# Patient Record
Sex: Female | Born: 1950 | Race: White | Hispanic: No | Smoking: Never smoker
Health system: Southern US, Community
[De-identification: ages and names within clinical notes are randomized; demographics above are authoritative.]

## PROBLEM LIST (undated history)

## (undated) DIAGNOSIS — IMO0002 Reserved for concepts with insufficient information to code with codable children: Secondary | ICD-10-CM

## (undated) DIAGNOSIS — I10 Essential (primary) hypertension: Secondary | ICD-10-CM

## (undated) DIAGNOSIS — M8580 Other specified disorders of bone density and structure, unspecified site: Secondary | ICD-10-CM

## (undated) DIAGNOSIS — N200 Calculus of kidney: Secondary | ICD-10-CM

## (undated) DIAGNOSIS — K219 Gastro-esophageal reflux disease without esophagitis: Secondary | ICD-10-CM

## (undated) DIAGNOSIS — M1990 Unspecified osteoarthritis, unspecified site: Secondary | ICD-10-CM

## (undated) DIAGNOSIS — I639 Cerebral infarction, unspecified: Secondary | ICD-10-CM

## (undated) DIAGNOSIS — N814 Uterovaginal prolapse, unspecified: Secondary | ICD-10-CM

## (undated) HISTORY — DX: Reserved for concepts with insufficient information to code with codable children: IMO0002

## (undated) HISTORY — DX: Other specified disorders of bone density and structure, unspecified site: M85.80

## (undated) HISTORY — DX: Unspecified osteoarthritis, unspecified site: M19.90

## (undated) HISTORY — PX: TONSILLECTOMY: SUR1361

## (undated) HISTORY — DX: Gastro-esophageal reflux disease without esophagitis: K21.9

## (undated) HISTORY — DX: Uterovaginal prolapse, unspecified: N81.4

## (undated) HISTORY — DX: Calculus of kidney: N20.0

## (undated) HISTORY — DX: Cerebral infarction, unspecified: I63.9

---

## 1997-08-24 ENCOUNTER — Other Ambulatory Visit: Admission: RE | Admit: 1997-08-24 | Discharge: 1997-08-24 | Payer: Self-pay | Admitting: Obstetrics and Gynecology

## 1999-02-08 ENCOUNTER — Other Ambulatory Visit: Admission: RE | Admit: 1999-02-08 | Discharge: 1999-02-08 | Payer: Self-pay | Admitting: Obstetrics and Gynecology

## 2000-02-29 ENCOUNTER — Other Ambulatory Visit: Admission: RE | Admit: 2000-02-29 | Discharge: 2000-02-29 | Payer: Self-pay | Admitting: Obstetrics and Gynecology

## 2001-06-17 ENCOUNTER — Other Ambulatory Visit: Admission: RE | Admit: 2001-06-17 | Discharge: 2001-06-17 | Payer: Self-pay | Admitting: Obstetrics and Gynecology

## 2002-07-24 ENCOUNTER — Other Ambulatory Visit: Admission: RE | Admit: 2002-07-24 | Discharge: 2002-07-24 | Payer: Self-pay | Admitting: Obstetrics and Gynecology

## 2003-08-18 ENCOUNTER — Other Ambulatory Visit: Admission: RE | Admit: 2003-08-18 | Discharge: 2003-08-18 | Payer: Self-pay | Admitting: Obstetrics and Gynecology

## 2004-10-03 ENCOUNTER — Other Ambulatory Visit: Admission: RE | Admit: 2004-10-03 | Discharge: 2004-10-03 | Payer: Self-pay | Admitting: Obstetrics and Gynecology

## 2005-10-05 ENCOUNTER — Other Ambulatory Visit: Admission: RE | Admit: 2005-10-05 | Discharge: 2005-10-05 | Payer: Self-pay | Admitting: Obstetrics and Gynecology

## 2006-10-11 ENCOUNTER — Other Ambulatory Visit: Admission: RE | Admit: 2006-10-11 | Discharge: 2006-10-11 | Payer: Self-pay | Admitting: Obstetrics and Gynecology

## 2007-10-16 ENCOUNTER — Other Ambulatory Visit: Admission: RE | Admit: 2007-10-16 | Discharge: 2007-10-16 | Payer: Self-pay | Admitting: Obstetrics and Gynecology

## 2007-11-04 ENCOUNTER — Ambulatory Visit: Payer: Self-pay | Admitting: Obstetrics and Gynecology

## 2008-11-18 ENCOUNTER — Ambulatory Visit: Payer: Self-pay | Admitting: Obstetrics and Gynecology

## 2008-11-18 ENCOUNTER — Other Ambulatory Visit: Admission: RE | Admit: 2008-11-18 | Discharge: 2008-11-18 | Payer: Self-pay | Admitting: Obstetrics and Gynecology

## 2008-11-18 ENCOUNTER — Encounter: Payer: Self-pay | Admitting: Obstetrics and Gynecology

## 2008-12-22 ENCOUNTER — Ambulatory Visit: Payer: Self-pay | Admitting: Cardiology

## 2008-12-22 DIAGNOSIS — E782 Mixed hyperlipidemia: Secondary | ICD-10-CM

## 2010-11-17 ENCOUNTER — Encounter: Payer: Self-pay | Admitting: Obstetrics and Gynecology

## 2012-01-24 ENCOUNTER — Encounter: Payer: Self-pay | Admitting: Gynecology

## 2012-01-24 ENCOUNTER — Encounter: Payer: Self-pay | Admitting: Obstetrics and Gynecology

## 2012-01-24 DIAGNOSIS — N814 Uterovaginal prolapse, unspecified: Secondary | ICD-10-CM | POA: Insufficient documentation

## 2012-01-24 DIAGNOSIS — IMO0002 Reserved for concepts with insufficient information to code with codable children: Secondary | ICD-10-CM | POA: Insufficient documentation

## 2012-02-05 ENCOUNTER — Other Ambulatory Visit (HOSPITAL_COMMUNITY)
Admission: RE | Admit: 2012-02-05 | Discharge: 2012-02-05 | Disposition: A | Discharge status: Home / Self Care | Payer: BC Managed Care – PPO | Source: Ambulatory Visit | Attending: Obstetrics and Gynecology | Admitting: Obstetrics and Gynecology

## 2012-02-05 ENCOUNTER — Ambulatory Visit (INDEPENDENT_AMBULATORY_CARE_PROVIDER_SITE_OTHER): Payer: BC Managed Care – PPO | Admitting: Obstetrics and Gynecology

## 2012-02-05 ENCOUNTER — Encounter: Payer: Self-pay | Admitting: Obstetrics and Gynecology

## 2012-02-05 VITALS — BP 120/70 | Ht 62.0 in | Wt 202.0 lb

## 2012-02-05 DIAGNOSIS — Z01419 Encounter for gynecological examination (general) (routine) without abnormal findings: Secondary | ICD-10-CM | POA: Insufficient documentation

## 2012-02-05 DIAGNOSIS — Z23 Encounter for immunization: Secondary | ICD-10-CM

## 2012-02-05 DIAGNOSIS — N200 Calculus of kidney: Secondary | ICD-10-CM | POA: Insufficient documentation

## 2012-02-05 LAB — CBC WITH DIFFERENTIAL/PLATELET
Basophils Relative: 0 % (ref 0–1)
Eosinophils Absolute: 0.1 10*3/uL (ref 0.0–0.7)
Lymphs Abs: 2.3 10*3/uL (ref 0.7–4.0)
MCH: 30.9 pg (ref 26.0–34.0)
Neutrophils Relative %: 42 % — ABNORMAL LOW (ref 43–77)
Platelets: 243 10*3/uL (ref 150–400)
RBC: 4.37 MIL/uL (ref 3.87–5.11)
WBC: 4.8 10*3/uL (ref 4.0–10.5)

## 2012-02-05 LAB — HEMOGLOBIN A1C
Hgb A1c MFr Bld: 6 % — ABNORMAL HIGH (ref <5.7)
Mean Plasma Glucose: 126 mg/dL — ABNORMAL HIGH (ref <117)

## 2012-02-05 LAB — LIPID PANEL
HDL: 61 mg/dL (ref >39)
LDL Cholesterol: 164 mg/dL — ABNORMAL HIGH (ref 0–99)

## 2012-02-05 NOTE — Addendum Note (Signed)
Addended by: Dayna Barker on: 02/05/2012 09:48 AM   Modules accepted: Orders

## 2012-02-05 NOTE — Progress Notes (Signed)
Patient came to see me today for her annual GYN exam. She is doing well without hormone replacement. She was previously on that. She is aware of her uterine prolapse and cystocele but does not feel it is severe enough to require surgery. She has no urinary incontinence or urgency. She's never had an abnormal Pap smear. Her last Pap smear was 2010. She has had 2 normal bone densities. Her last bone density was 2009. She has never had a colonoscopy. We referred her to Dr. Daleen Squibb because of elevated lipids. At that point she did not require medication. She has both her paternal and maternal aunt who had early onset breast cancer. She doesn't think either was tested for BRCA1 and 2. She just  had a mammogram.  Physical examination:Joanna Good present. HEENT within normal limits. Neck: Thyroid not large. No masses. Supraclavicular nodes: not enlarged. Breasts: Examined in both sitting and lying  position. No skin changes and no masses. Abdomen: Soft no guarding rebound or masses or hernia. Pelvic: External: Within normal limits. BUS: Within normal limits. Vaginal:within normal limits. Good estrogen effect. Second-degree cystocele. Cervix: clean. Uterus: Normal size and shape With 1.5 uterine descensus.  Adnexa: No masses. Rectovaginal exam: Confirmatory and negative. Extremities: Within normal limits.  Assessment: Uterine prolapse. Cystocele. Family history of early onset breast cancer. Hyperlipidemia.  Plan: Lipid panel done fasting. Asked  patient to  schedule colonoscopy. Patient to go to cancer center for genetic testing. Continue yearly mammograms. Pap done.The new Pap smear guidelines were discussed with the patient.

## 2012-02-05 NOTE — Patient Instructions (Signed)
Call [facility] to discuss genetic testing for BRCA1 and BRCA2.

## 2012-02-06 LAB — URINALYSIS W MICROSCOPIC + REFLEX CULTURE
Bilirubin Urine: NEGATIVE
Casts: NONE SEEN
Crystals: NONE SEEN
Glucose, UA: NEGATIVE mg/dL
Specific Gravity, Urine: 1.01 (ref 1.005–1.030)
pH: 7.5 (ref 5.0–8.0)

## 2012-02-09 LAB — URINE CULTURE

## 2012-02-15 ENCOUNTER — Encounter: Payer: Self-pay | Admitting: Obstetrics and Gynecology

## 2013-06-19 ENCOUNTER — Encounter: Payer: Self-pay | Admitting: Women's Health

## 2013-07-10 ENCOUNTER — Encounter: Payer: Self-pay | Admitting: Women's Health

## 2013-07-30 ENCOUNTER — Other Ambulatory Visit: Payer: Self-pay | Admitting: Women's Health

## 2013-07-30 ENCOUNTER — Encounter: Payer: Self-pay | Admitting: Women's Health

## 2013-07-30 ENCOUNTER — Ambulatory Visit (INDEPENDENT_AMBULATORY_CARE_PROVIDER_SITE_OTHER): Payer: BC Managed Care – PPO | Admitting: Women's Health

## 2013-07-30 VITALS — BP 120/76 | Ht 62.0 in | Wt 218.0 lb

## 2013-07-30 DIAGNOSIS — Z833 Family history of diabetes mellitus: Secondary | ICD-10-CM

## 2013-07-30 DIAGNOSIS — E079 Disorder of thyroid, unspecified: Secondary | ICD-10-CM

## 2013-07-30 DIAGNOSIS — Z01419 Encounter for gynecological examination (general) (routine) without abnormal findings: Secondary | ICD-10-CM

## 2013-07-30 DIAGNOSIS — Z13220 Encounter for screening for lipoid disorders: Secondary | ICD-10-CM

## 2013-07-30 LAB — CBC WITH DIFFERENTIAL/PLATELET
BASOS ABS: 0 10*3/uL (ref 0.0–0.1)
Basophils Relative: 1 % (ref 0–1)
Eosinophils Absolute: 0 10*3/uL (ref 0.0–0.7)
Eosinophils Relative: 1 % (ref 0–5)
HEMATOCRIT: 39.9 % (ref 36.0–46.0)
HEMOGLOBIN: 13.6 g/dL (ref 12.0–15.0)
LYMPHS PCT: 49 % — AB (ref 12–46)
Lymphs Abs: 2.1 10*3/uL (ref 0.7–4.0)
MCH: 30.6 pg (ref 26.0–34.0)
MCHC: 34.1 g/dL (ref 30.0–36.0)
MCV: 89.7 fL (ref 78.0–100.0)
MONO ABS: 0.3 10*3/uL (ref 0.1–1.0)
Monocytes Relative: 7 % (ref 3–12)
NEUTROS ABS: 1.8 10*3/uL (ref 1.7–7.7)
Neutrophils Relative %: 42 % — ABNORMAL LOW (ref 43–77)
Platelets: 241 10*3/uL (ref 150–400)
RBC: 4.45 MIL/uL (ref 3.87–5.11)
RDW: 13.9 % (ref 11.5–15.5)
WBC: 4.2 10*3/uL (ref 4.0–10.5)

## 2013-07-30 LAB — COMPREHENSIVE METABOLIC PANEL
ALBUMIN: 4.2 g/dL (ref 3.5–5.2)
ALT: 11 U/L (ref 0–35)
AST: 15 U/L (ref 0–37)
Alkaline Phosphatase: 72 U/L (ref 39–117)
BUN: 11 mg/dL (ref 6–23)
CALCIUM: 9.4 mg/dL (ref 8.4–10.5)
CHLORIDE: 104 meq/L (ref 96–112)
CO2: 27 mEq/L (ref 19–32)
Creat: 0.8 mg/dL (ref 0.50–1.10)
Glucose, Bld: 121 mg/dL — ABNORMAL HIGH (ref 70–99)
POTASSIUM: 4.5 meq/L (ref 3.5–5.3)
Sodium: 138 mEq/L (ref 135–145)
Total Bilirubin: 0.5 mg/dL (ref 0.2–1.2)
Total Protein: 6.8 g/dL (ref 6.0–8.3)

## 2013-07-30 LAB — LIPID PANEL
Cholesterol: 215 mg/dL — ABNORMAL HIGH (ref 0–200)
HDL: 56 mg/dL (ref >39)
LDL CALC: 140 mg/dL — AB (ref 0–99)
Total CHOL/HDL Ratio: 3.8 Ratio
Triglycerides: 97 mg/dL (ref <150)
VLDL: 19 mg/dL (ref 0–40)

## 2013-07-30 NOTE — Progress Notes (Signed)
Joanna Good 04-25-1950 [id number]    History:    Presents for annual exam. Patient Dr. Eda Paschal last in 2013. Postmenopausal /no HRT/no bleeding/not sexually active-husbands health.. Normal Pap and mammogram history. Normal DEXA x2, per chart reports, scans not available. Has not had a colonoscopy. Asymptomatic cystocele with uterine prolapse.  Past medical history, past surgical history, family history and social history were all reviewed and documented in the EPIC chart. Retired principal, working part time with gifted students. Maternal aunt, paternal aunt-breast cancer. Father diabetes. Has 2 children 4 grandchildren all doing well.  ROS:  A  12 point ROS was performed and pertinent positives and negatives are included.  Exam:  Filed Vitals:   07/30/13 0833  BP: 120/76    General appearance:  Normal Thyroid:  Symmetrical, normal in size, without palpable masses or nodularity. Respiratory  Auscultation:  Clear without wheezing or rhonchi Cardiovascular  Auscultation:  Regular rate, without rubs, murmurs or gallops  Edema/varicosities:  Not grossly evident Abdominal  Soft,nontender, without masses, guarding or rebound.  Liver/spleen:  No organomegaly noted  Hernia:  None appreciated  Skin  Inspection:  Grossly normal   Breasts: Examined lying and sitting.     Right: Without masses, retractions, discharge or axillary adenopathy.     Left: Without masses, retractions, discharge or axillary adenopathy. Gentitourinary   Inguinal/mons:  Normal without inguinal adenopathy  External genitalia:  Normal  BUS/Urethra/Skene's glands:  Normal  Vagina:  +3 cystocele  Cervix:  Normal  Uterus:   normal in size, shape and contour.  Midline and mobile  Adnexa/parametria:     Rt: Without masses or tenderness.   Lt: Without masses or tenderness.  Anus and perineum: Normal  Digital rectal exam: Normal sphincter tone without palpated masses or tenderness  Assessment/Plan:  63 y.o.MWF  G2P2  for annual exam with no complaints.  Postmenopausal/no HRT/no bleeding + 3 cystocele - asymptomatic  Plan: SBE's, continue annual mammogram, calcium rich diet, vitamin D 2000 daily encouraged. CBC, comprehensive metabolic panel, lipid panel, TSH, UA, Pap normal 2013, new screening guidelines reviewed. Zostavac and screening colonoscopy recommended. Denies urinary symptoms or discomfort from cystocele, will watch at this time.   [place]: This dictation was prepared with Dragon/digital dictation.  Any transcriptional errors that result are unintentional. Harrington Challenger [facility], 10:01 AM 07/30/2013

## 2013-07-30 NOTE — Patient Instructions (Signed)
Health Recommendations for Postmenopausal Women Respected and ongoing research has looked at the most common causes of death, disability, and poor quality of life in postmenopausal women. The causes include heart disease, diseases of blood vessels, diabetes, depression, cancer, and bone loss (osteoporosis). Many things can be done to help lower the chances of developing these and other common problems: CARDIOVASCULAR DISEASE Heart Disease: A heart attack is a medical emergency. Know the signs and symptoms of a heart attack. Below are things women can do to reduce their risk for heart disease.   Do not smoke. If you smoke, quit.  Aim for a healthy weight. Being overweight causes many preventable deaths. Eat a healthy and balanced diet and drink an adequate amount of liquids.  Get moving. Make a commitment to be more physically active. Aim for 30 minutes of activity on most, if not all days of the week.  Eat for heart health. Choose a diet that is low in saturated fat and cholesterol and eliminate trans fat. Include whole grains, vegetables, and fruits. Read and understand the labels on food containers before buying.  Know your numbers. Ask your caregiver to check your blood pressure, cholesterol (total, HDL, LDL, triglycerides) and blood glucose. Work with your caregiver on improving your entire clinical picture.  High blood pressure. Limit or stop your table salt intake (try salt substitute and food seasonings). Avoid salty foods and drinks. Read labels on food containers before buying. Eating well and exercising can help control high blood pressure. STROKE  Stroke is a medical emergency. Stroke may be the result of a blood clot in a blood vessel in the brain or by a brain hemorrhage (bleeding). Know the signs and symptoms of a stroke. To lower the risk of developing a stroke:  Avoid fatty foods.  Quit smoking.  Control your diabetes, blood pressure, and irregular heart rate. THROMBOPHLEBITIS  (BLOOD CLOT) OF THE LEG  Becoming overweight and leading a stationary lifestyle may also contribute to developing blood clots. Controlling your diet and exercising will help lower the risk of developing blood clots. CANCER SCREENING  Breast Cancer: Take steps to reduce your risk of breast cancer.  You should practice "breast self-awareness." This means understanding the normal appearance and feel of your breasts and should include breast self-examination. Any changes detected, no matter how small, should be reported to your caregiver.  After age 40, you should have a clinical breast exam (CBE) every year.  Starting at age 40, you should consider having a mammogram (breast X-ray) every year.  If you have a family history of breast cancer, talk to your caregiver about genetic screening.  If you are at high risk for breast cancer, talk to your caregiver about having an MRI and a mammogram every year.  Intestinal or Stomach Cancer: Tests to consider are a rectal exam, fecal occult blood, sigmoidoscopy, and colonoscopy. Women who are high risk may need to be screened at an earlier age and more often.  Cervical Cancer:  Beginning at age 30, you should have a Pap test every 3 years as long as the past 3 Pap tests have been normal.  If you have had past treatment for cervical cancer or a condition that could lead to cancer, you need Pap tests and screening for cancer for at least 20 years after your treatment.  If you had a hysterectomy for a problem that was not cancer or a condition that could lead to cancer, then you no longer need Pap tests.    If you are between ages 65 and 70, and you have had normal Pap tests going back 10 years, you no longer need Pap tests.  If Pap tests have been discontinued, risk factors (such as a new sexual partner) need to be reassessed to determine if screening should be resumed.  Some medical problems can increase the chance of getting cervical cancer. In these  cases, your caregiver may recommend more frequent screening and Pap tests.  Uterine Cancer: If you have vaginal bleeding after reaching menopause, you should notify your caregiver.  Ovarian cancer: Other than yearly pelvic exams, there are no reliable tests available to screen for ovarian cancer at this time except for yearly pelvic exams.  Lung Cancer: Yearly chest X-rays can detect lung cancer and should be done on high risk women, such as cigarette smokers and women with chronic lung disease (emphysema).  Skin Cancer: A complete body skin exam should be done at your yearly examination. Avoid overexposure to the sun and ultraviolet light lamps. Use a strong sun block cream when in the sun. All of these things are important in lowering the risk of skin cancer. MENOPAUSE Menopause Symptoms: Hormone therapy products are effective for treating symptoms associated with menopause:  Moderate to severe hot flashes.  Night sweats.  Mood swings.  Headaches.  Tiredness.  Loss of sex drive.  Insomnia.  Other symptoms. Hormone replacement carries certain risks, especially in older women. Women who use or are thinking about using estrogen or estrogen with progestin treatments should discuss that with their caregiver. Your caregiver will help you understand the benefits and risks. The ideal dose of hormone replacement therapy is not known. The Food and Drug Administration (FDA) has concluded that hormone therapy should be used only at the lowest doses and for the shortest amount of time to reach treatment goals.  OSTEOPOROSIS Protecting Against Bone Loss and Preventing Fracture: If you use hormone therapy for prevention of bone loss (osteoporosis), the risks for bone loss must outweigh the risk of the therapy. Ask your caregiver about other medications known to be safe and effective for preventing bone loss and fractures. To guard against bone loss or fractures, the following is recommended:  If  you are less than age 50, take 1000 mg of calcium and at least 600 mg of Vitamin D per day.  If you are greater than age 50 but less than age 70, take 1200 mg of calcium and at least 600 mg of Vitamin D per day.  If you are greater than age 70, take 1200 mg of calcium and at least 800 mg of Vitamin D per day. Smoking and excessive alcohol intake increases the risk of osteoporosis. Eat foods rich in calcium and vitamin D and do weight bearing exercises several times a week as your caregiver suggests. DIABETES Diabetes Melitus: If you have Type I or Type 2 diabetes, you should keep your blood sugar under control with diet, exercise and recommended medication. Avoid too many sweets, starchy and fatty foods. Being overweight can make control more difficult. COGNITION AND MEMORY Cognition and Memory: Menopausal hormone therapy is not recommended for the prevention of cognitive disorders such as Alzheimer's disease or memory loss.  DEPRESSION  Depression may occur at any age, but is common in elderly women. The reasons may be because of physical, medical, social (loneliness), or financial problems and needs. If you are experiencing depression because of medical problems and control of symptoms, talk to your caregiver about this. Physical activity and   exercise may help with mood and sleep. Community and volunteer involvement may help your sense of value and worth. If you have depression and you feel that the problem is getting worse or becoming severe, talk to your caregiver about treatment options that are best for you. ACCIDENTS  Accidents are common and can be serious in the elderly woman. Prepare your house to prevent accidents. Eliminate throw rugs, place hand bars in the bath, shower and toilet areas. Avoid wearing high heeled shoes or walking on wet, snowy, and icy areas. Limit or stop driving if you have vision or hearing problems, or you feel you are unsteady with you movements and  reflexes. HEPATITIS C Hepatitis C is a type of viral infection affecting the liver. It is spread mainly through contact with blood from an infected person. It can be treated, but if left untreated, it can lead to severe liver damage over years. Many people who are infected do not know that the virus is in their blood. If you are a "baby-boomer", it is recommended that you have one screening test for Hepatitis C. IMMUNIZATIONS  Several immunizations are important to consider having during your senior years, including:   Tetanus, diptheria, and pertussis booster shot.  Influenza every year before the flu season begins.  Pneumonia vaccine.  Shingles vaccine.  Others as indicated based on your specific needs. Talk to your caregiver about these. Document Released: 04/13/2005 Document Revised: 02/06/2012 Document Reviewed: 12/08/2007 [employer] Patient Information 2014 [employer].  

## 2013-07-31 LAB — HEMOGLOBIN A1C
Hgb A1c MFr Bld: 5.9 % — ABNORMAL HIGH (ref <5.7)
Mean Plasma Glucose: 123 mg/dL — ABNORMAL HIGH (ref <117)

## 2013-07-31 LAB — TSH: TSH: 1.008 u[IU]/mL (ref 0.350–4.500)

## 2014-01-04 ENCOUNTER — Encounter: Payer: Self-pay | Admitting: Women's Health

## 2014-01-06 ENCOUNTER — Other Ambulatory Visit (HOSPITAL_COMMUNITY): Payer: Self-pay | Admitting: Internal Medicine

## 2014-01-06 ENCOUNTER — Ambulatory Visit (HOSPITAL_COMMUNITY)
Admission: RE | Admit: 2014-01-06 | Discharge: 2014-01-06 | Disposition: A | Discharge status: Home / Self Care | Payer: BC Managed Care – PPO | Source: Ambulatory Visit | Attending: Internal Medicine | Admitting: Internal Medicine

## 2014-01-06 DIAGNOSIS — R202 Paresthesia of skin: Secondary | ICD-10-CM | POA: Diagnosis present

## 2014-01-06 DIAGNOSIS — I639 Cerebral infarction, unspecified: Secondary | ICD-10-CM

## 2014-01-11 ENCOUNTER — Other Ambulatory Visit (HOSPITAL_COMMUNITY): Payer: Self-pay | Admitting: Internal Medicine

## 2014-01-11 DIAGNOSIS — H539 Unspecified visual disturbance: Secondary | ICD-10-CM

## 2014-01-12 ENCOUNTER — Ambulatory Visit (HOSPITAL_COMMUNITY)
Admission: RE | Admit: 2014-01-12 | Discharge: 2014-01-12 | Disposition: A | Discharge status: Home / Self Care | Payer: BC Managed Care – PPO | Source: Ambulatory Visit | Attending: Internal Medicine | Admitting: Internal Medicine

## 2014-01-12 DIAGNOSIS — H539 Unspecified visual disturbance: Secondary | ICD-10-CM

## 2014-01-12 DIAGNOSIS — H538 Other visual disturbances: Secondary | ICD-10-CM | POA: Insufficient documentation

## 2014-01-12 DIAGNOSIS — I639 Cerebral infarction, unspecified: Secondary | ICD-10-CM | POA: Insufficient documentation

## 2014-01-12 DIAGNOSIS — R200 Anesthesia of skin: Secondary | ICD-10-CM | POA: Diagnosis not present

## 2014-01-13 ENCOUNTER — Encounter: Payer: Self-pay | Admitting: *Deleted

## 2014-01-13 ENCOUNTER — Other Ambulatory Visit (HOSPITAL_COMMUNITY): Payer: Self-pay | Admitting: Internal Medicine

## 2014-01-13 ENCOUNTER — Ambulatory Visit (HOSPITAL_COMMUNITY)
Admission: RE | Admit: 2014-01-13 | Discharge: 2014-01-13 | Disposition: A | Discharge status: Home / Self Care | Payer: BC Managed Care – PPO | Source: Ambulatory Visit | Attending: Internal Medicine | Admitting: Internal Medicine

## 2014-01-13 DIAGNOSIS — I639 Cerebral infarction, unspecified: Secondary | ICD-10-CM

## 2014-01-13 DIAGNOSIS — H539 Unspecified visual disturbance: Secondary | ICD-10-CM | POA: Diagnosis not present

## 2014-01-13 DIAGNOSIS — R200 Anesthesia of skin: Secondary | ICD-10-CM | POA: Diagnosis not present

## 2014-01-14 ENCOUNTER — Ambulatory Visit (INDEPENDENT_AMBULATORY_CARE_PROVIDER_SITE_OTHER): Payer: Self-pay

## 2014-01-14 ENCOUNTER — Encounter: Payer: Self-pay | Admitting: Neurology

## 2014-01-14 ENCOUNTER — Ambulatory Visit (INDEPENDENT_AMBULATORY_CARE_PROVIDER_SITE_OTHER): Payer: BC Managed Care – PPO | Admitting: Neurology

## 2014-01-14 ENCOUNTER — Ambulatory Visit (HOSPITAL_COMMUNITY)
Admission: RE | Admit: 2014-01-14 | Discharge: 2014-01-14 | Disposition: A | Discharge status: Home / Self Care | Payer: BC Managed Care – PPO | Source: Ambulatory Visit | Attending: Internal Medicine | Admitting: Internal Medicine

## 2014-01-14 VITALS — BP 140/70 | HR 85 | Ht 63.25 in | Wt 220.0 lb

## 2014-01-14 DIAGNOSIS — Z0289 Encounter for other administrative examinations: Secondary | ICD-10-CM

## 2014-01-14 DIAGNOSIS — I639 Cerebral infarction, unspecified: Secondary | ICD-10-CM

## 2014-01-14 DIAGNOSIS — E785 Hyperlipidemia, unspecified: Secondary | ICD-10-CM | POA: Insufficient documentation

## 2014-01-14 DIAGNOSIS — I340 Nonrheumatic mitral (valve) insufficiency: Secondary | ICD-10-CM | POA: Diagnosis not present

## 2014-01-14 DIAGNOSIS — I6340 Cerebral infarction due to embolism of unspecified cerebral artery: Secondary | ICD-10-CM

## 2014-01-14 DIAGNOSIS — I517 Cardiomegaly: Secondary | ICD-10-CM

## 2014-01-14 MED ORDER — ATORVASTATIN CALCIUM 20 MG PO TABS: 20.0000 mg | ORAL_TABLET | Freq: Every day | ORAL | Status: DC

## 2014-01-14 NOTE — Progress Notes (Signed)
[facility] [address] [place]. [place] [id number] [phone]       OFFICE CONSULT NOTE  [name] Date of Birth:  04/03/1950 Medical Record Number:  [id number]   Referring MD: Catalina PizzaZach Hall Reason for Referral:  Stroke  HPI: [age] year pleasant Caucasian lady who on 01/05/14 noted  slight tingling involving the left lip  but ignored it. Next morning she woke up with the tingling involving the left cheek as well as the leg and later on involving the left hand as well. She describes warmth-like sensation in the left side. She saw Dr. Margo AyeHall who ordered a CT scan of the head which was unremarkable. Patient's symptoms persisted and in fact 3 days later she noticed some decreased peripheral vision on the left with seeing spots as well as her taste was quite altered. 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 She underwent carotid ultrasound on 01/13/14 in [place] which shows no significant extra cranial vascular stenosis. Transthoracic echo was done today 01/14/14 which shows normal ejection fraction without cardiac source of embolism. Review of her prior labs in the computer show hemoglobin A1c of 5.8 on 07/30/13 with total cholesterol of 215, LDL 140, HDL 56 and triglycerides 97 mg percent. She has no known history of atrial fibrillation, palpitations or cardiac problems. She has no history of hypertension , smoking and diabetes and stroke in a young age. She has no history of DVT or pulmonary embolism. She was started on aspirin 325 mg but has not been started on statin yet. She has noted improvement in her paresthesias in her arms though she still has some numbness on her left cheek and fingertips. Her vision difficulties seem to have resolved.  ROS:   14 system review of systems is positive for numbness, tingling, feeling cold, joint pain, hip  pain and all other systems negative  PMH:  Past Medical History  Diagnosis Date  . Uterine prolapse   . Cystocele   . Kidney stones   . High cholesterol   . Acid reflux     Social History:  History   Social History  . Marital Status: Married    Spouse Name: N/A    Number of Children: 2  . Years of Education: MASTERS   Occupational History  . Not on file.   Social History Main Topics  . Smoking status: Never Smoker   . Smokeless tobacco: Never Used  . Alcohol Use: 0.0 oz/week    0 Not specified per week     Comment: Rare  . Drug Use: No  . Sexual Activity: No   Other Topics Concern  . Not on file   Social History Narrative   Patient is married with 2 children.   Patient is right handed.   Patient has her Masters degree.   Patient drinks 2 cups daily.    Medications:   Current Outpatient Prescriptions on File Prior to Visit  Medication Sig Dispense Refill  . Calcium Carbonate-Vitamin D (CALCIUM + D PO) Take by mouth.    . Multiple Vitamin (MULTIVITAMIN) tablet Take 1 tablet by mouth daily.    . Omega-3 Fatty Acids (FISH OIL PO) Take by mouth.    . triamcinolone (NASACORT AQ) 55 MCG/ACT AERO nasal inhaler Place 2 sprays into the nose daily.     No current facility-administered medications on  file prior to visit.    Allergies:   Allergies  Allergen Reactions  . Bee Venom   . Claritin [Loratadine] Nausea And Vomiting  . Ibuprofen   . Sulfonamide Derivatives     Physical Exam General: well developed, well nourished, seated, in no evident distress Head: head normocephalic and atraumatic.   Neck: supple with no carotid or supraclavicular bruits Cardiovascular: regular rate and rhythm, no murmurs Musculoskeletal: no deformity Skin:  no rash/petichiae Vascular:  Normal pulses all extremities Filed Vitals:   01/14/14 1250  BP: 140/70  Pulse: 85    Neurologic Exam Mental Status: Awake and fully alert. Oriented to place and time. Recent and remote  memory intact. Attention span, concentration and fund of knowledge appropriate. Mood and affect appropriate.  Cranial Nerves: Fundoscopic exam reveals sharp disc margins. Pupils equal, briskly reactive to light. Extraocular movements full without nystagmus. Visual fields full to confrontation. Hearing intact. Facial sensation intact. Face, tongue, palate moves normally and symmetrically.  Motor: Normal bulk and tone. Normal strength in all tested extremity muscles. Sensory.: intact to touch , pinprick , position and vibratory sensation. Except left cheek and fingertips  Coordination: Rapid alternating movements normal in all extremities. Finger-to-nose and heel-to-shin performed accurately bilaterally. Gait and Station: Arises from chair without difficulty. Stance is normal. Gait demonstrates normal stride length and balance . Able to heel, toe and tandem walk without difficulty.  Reflexes: 1+ and symmetric. Toes downgoing.   NIHSS  1 Modified Rankin 1   ASSESSMENT: [id number] year Caucasian lady with intermittent left face arm and leg paresthesias as well as vision difficulties due to embolic right occipital and thalamic infarcts one week ago. Neurological exam reveals only minor paresthesias but no visual field deficits. Stroke workup so far unyielding but incomplete. Vascular risk factors of hyperlipidemia only    PLAN: I had a long discussion with the patient with regards to her recent right occipital and thalamic infarcts, discussed and reviewed personally her imaging studies, echocardiogram carotid dopplers,prior lipid profile results and answered questions. Continue aspirin for stroke prevention and start Lipitor 20 mg daily with LDL cholesterol goal below 170 mg percent. Check transcranial Doppler study. We discussed risks for recurrent strokes, stroke risk factors and need to maintain aggressive risk factor reduction, healthy diet and regular activity and maintain ideal weight.The patient may  consider possible participation in the RESPECT ESUS trial. She has shown some interest. We will give her written information to review to see if she wants to participate.Patient needs a heart monitor to evaluate for paroxysmal atrial fibrillation which can be done through the trial if she chooses to participate. Return for follow-up in 2 months or call earlier if necessary.    Note: This document was prepared with digital dictation and possible smart phrase technology. Any transcriptional errors that result from this process are unintentional.

## 2014-01-14 NOTE — Progress Notes (Signed)
  Echocardiogram 2D Echocardiogram has been performed.  Joanna Good 01/14/2014, 9:33 AM

## 2014-01-14 NOTE — Patient Instructions (Signed)
I had a long discussion with the patient with regards to her recent right occipital and thalamic infarcts, discussed and reviewed personally her imaging studies, echocardiogram carotid dopplers,prior lipid profile results and answered questions. Continue aspirin for stroke prevention and start Lipitor 20 mg daily with LDL cholesterol goal below 170 mg percent. Check transcranial Doppler study. We discussed risks for recurrent strokes, stroke risk factors and need to maintain aggressive risk factor reduction, healthy diet and regular activity and maintain ideal weight.The patient may consider possible participation in the RESPECT ESUS trial. She has shown some interest. We will give her written information to review to see if she wants to participate.Patient needs a heart monitor to evaluate for paroxysmal atrial fibrillation which can be done through the trial if she chooses to participate. Return for follow-up in 2 months or call earlier if necessary.   Stroke Prevention Some medical conditions and behaviors are associated with an increased chance of having a stroke. You may prevent a stroke by making healthy choices and managing medical conditions. HOW CAN I REDUCE MY RISK OF HAVING A STROKE?   Stay physically active. Get at least 30 minutes of activity on most or all days.  Do not smoke. It may also be helpful to avoid exposure to secondhand smoke.  Limit alcohol use. Moderate alcohol use is considered to be:  No more than 2 drinks per day for men.  No more than 1 drink per day for nonpregnant women.  Eat healthy foods. This involves:  Eating 5 or more servings of fruits and vegetables a day.  Making dietary changes that address high blood pressure (hypertension), high cholesterol, diabetes, or obesity.  Manage your cholesterol levels.  Making food choices that are high in fiber and low in saturated fat, trans fat, and cholesterol may control cholesterol levels.  Take any prescribed  medicines to control cholesterol as directed by your health care provider.  Manage your diabetes.  Controlling your carbohydrate and sugar intake is recommended to manage diabetes.  Take any prescribed medicines to control diabetes as directed by your health care provider.  Control your hypertension.  Making food choices that are low in salt (sodium), saturated fat, trans fat, and cholesterol is recommended to manage hypertension.  Take any prescribed medicines to control hypertension as directed by your health care provider.  Maintain a healthy weight.  Reducing calorie intake and making food choices that are low in sodium, saturated fat, trans fat, and cholesterol are recommended to manage weight.  Stop drug abuse.  Avoid taking birth control pills.  Talk to your health care provider about the risks of taking birth control pills if you are over 63 years old, smoke, get migraines, or have ever had a blood clot.  Get evaluated for sleep disorders (sleep apnea).  Talk to your health care provider about getting a sleep evaluation if you snore a lot or have excessive sleepiness.  Take medicines only as directed by your health care provider.  For some people, aspirin or blood thinners (anticoagulants) are helpful in reducing the risk of forming abnormal blood clots that can lead to stroke. If you have the irregular heart rhythm of atrial fibrillation, you should be on a blood thinner unless there is a good reason you cannot take them.  Understand all your medicine instructions.  Make sure that other conditions (such as anemia or atherosclerosis) are addressed. SEEK IMMEDIATE MEDICAL CARE IF:   You have sudden weakness or numbness of the face, arm, or leg,  especially on one side of the body.  Your face or eyelid droops to one side.  You have sudden confusion.  You have trouble speaking (aphasia) or understanding.  You have sudden trouble seeing in one or both eyes.  You have  sudden trouble walking.  You have dizziness.  You have a loss of balance or coordination.  You have a sudden, severe headache with no known cause.  You have new chest pain or an irregular heartbeat. Any of these symptoms may represent a serious problem that is an emergency. Do not wait to see if the symptoms will go away. Get medical help at once. Call your local emergency services (911 in [place] Do not drive yourself to the hospital. Document Released: 03/29/2004 Document Revised: 07/06/2013 Document Reviewed: 08/22/2012 [facility] Patient Information 2015 [place], [place]. This information is not intended to replace advice given to you by your health care provider. Make sure you discuss any questions you have with your health care provider.

## 2014-01-18 ENCOUNTER — Telehealth: Payer: Self-pay | Admitting: Neurology

## 2014-01-18 ENCOUNTER — Other Ambulatory Visit: Payer: Self-pay | Admitting: Neurology

## 2014-01-18 DIAGNOSIS — I6300 Cerebral infarction due to thrombosis of unspecified precerebral artery: Secondary | ICD-10-CM

## 2014-01-21 ENCOUNTER — Telehealth: Payer: Self-pay | Admitting: Neurology

## 2014-01-21 ENCOUNTER — Other Ambulatory Visit: Payer: Self-pay | Admitting: Neurology

## 2014-01-21 ENCOUNTER — Telehealth: Payer: Self-pay

## 2014-01-21 DIAGNOSIS — I6340 Cerebral infarction due to embolism of unspecified cerebral artery: Secondary | ICD-10-CM

## 2014-01-21 NOTE — Telephone Encounter (Signed)
Called patient to let her know that we have scheduled her for a CTA and basic metabolic panel on 01/26/14. I could not reach her but left her a message to call me today so I could give her the details.

## 2014-01-21 NOTE — Telephone Encounter (Signed)
Patient wanted to speak with the Research Coordinator Amy Clare GandyRiddle about a study trial. Coordinator was not present at the moment, and I told the patient that the coordinator would call her back.

## 2014-01-21 NOTE — Telephone Encounter (Signed)
Called patient and left message that results for testing ordered by Dr Pearlean BrownieSethi were not available yet, instructed that patient should contact Dr Scharlene GlossHall's office for the results of the testing that was done on Wednesday 01/13/14, and to call the office back with any questions or concerns. Dr Pearlean BrownieSethi do you have results available yet

## 2014-01-26 ENCOUNTER — Encounter (HOSPITAL_COMMUNITY): Payer: Self-pay

## 2014-01-26 ENCOUNTER — Ambulatory Visit (HOSPITAL_COMMUNITY): Payer: BC Managed Care – PPO

## 2014-01-26 ENCOUNTER — Ambulatory Visit (HOSPITAL_COMMUNITY)
Admission: RE | Admit: 2014-01-26 | Discharge: 2014-01-26 | Disposition: A | Discharge status: Home / Self Care | Payer: BC Managed Care – PPO | Source: Ambulatory Visit | Attending: Neurology | Admitting: Neurology

## 2014-01-26 DIAGNOSIS — H538 Other visual disturbances: Secondary | ICD-10-CM | POA: Diagnosis not present

## 2014-01-26 DIAGNOSIS — I6300 Cerebral infarction due to thrombosis of unspecified precerebral artery: Secondary | ICD-10-CM | POA: Insufficient documentation

## 2014-01-26 LAB — POCT I-STAT CREATININE: Creatinine, Ser: 0.8 mg/dL (ref 0.50–1.10)

## 2014-01-26 MED ORDER — IOHEXOL 350 MG/ML SOLN
50.0000 mL | Freq: Once | INTRAVENOUS | Status: AC | PRN
  Administered 2014-01-26: 50 mL via INTRAVENOUS

## 2014-02-01 ENCOUNTER — Telehealth: Payer: Self-pay | Admitting: Neurology

## 2014-02-01 NOTE — Telephone Encounter (Signed)
Patient is calling to discuss Cat Scan results. Please call.

## 2014-02-01 NOTE — Telephone Encounter (Signed)
Patient requesting CT results, please call patient.

## 2014-02-10 ENCOUNTER — Encounter (INDEPENDENT_AMBULATORY_CARE_PROVIDER_SITE_OTHER): Payer: BC Managed Care – PPO

## 2014-02-10 ENCOUNTER — Encounter (INDEPENDENT_AMBULATORY_CARE_PROVIDER_SITE_OTHER): Payer: Self-pay

## 2014-02-10 DIAGNOSIS — Z0289 Encounter for other administrative examinations: Secondary | ICD-10-CM

## 2014-02-12 ENCOUNTER — Encounter (INDEPENDENT_AMBULATORY_CARE_PROVIDER_SITE_OTHER): Payer: Self-pay

## 2014-02-12 ENCOUNTER — Ambulatory Visit (INDEPENDENT_AMBULATORY_CARE_PROVIDER_SITE_OTHER): Payer: Self-pay | Admitting: Neurology

## 2014-02-12 DIAGNOSIS — I6340 Cerebral infarction due to embolism of unspecified cerebral artery: Secondary | ICD-10-CM

## 2014-02-12 DIAGNOSIS — Z0289 Encounter for other administrative examinations: Secondary | ICD-10-CM

## 2014-02-12 DIAGNOSIS — I639 Cerebral infarction, unspecified: Secondary | ICD-10-CM

## 2014-02-12 NOTE — Progress Notes (Signed)
The patient was seen today for followup with the research protocol.

## 2014-04-05 ENCOUNTER — Encounter: Payer: Self-pay | Admitting: Neurology

## 2014-04-05 ENCOUNTER — Ambulatory Visit (INDEPENDENT_AMBULATORY_CARE_PROVIDER_SITE_OTHER): Payer: BC Managed Care – PPO | Admitting: Neurology

## 2014-04-05 VITALS — BP 114/73 | HR 86 | Ht 63.25 in | Wt 225.4 lb

## 2014-04-05 DIAGNOSIS — I6990 Unspecified sequelae of unspecified cerebrovascular disease: Secondary | ICD-10-CM

## 2014-04-05 NOTE — Patient Instructions (Signed)
I had a long d/w patient about her recent stroke, risk for recurrent stroke/TIAs, personally independently reviewed imaging studies and stroke evaluation results and answered questions.Continue RESPECT ESUS study medication( Pradaxa versus aspirin ) for secondary stroke prevention and maintain strict control of hypertension with blood pressure goal below 130/90, diabetes with hemoglobin A1c goal below 6.5% and lipids with LDL cholesterol goal below 100 mg/dL. Followup in the future as per study protocol on 05/14/2014

## 2014-04-05 NOTE — Progress Notes (Signed)
[facility] [address] [place]. [place] [address] [phone]       OFFICE CONSULT NOTE  [name] Date of Birth:  [birth year] Medical Record Number:  [id number]   Referring MD: Joanna Good Reason for Referral:  Stroke  Initial Consult 01/14/2014: 63 year pleasant Caucasian lady who on 01/05/14 noted  slight tingling involving the left lip  but ignored it. Next morning she woke up with the tingling involving the left cheek as well as the leg and later on involving the left hand as well. She describes warmth-like sensation in the left side. She saw Dr. Margo Good who ordered a CT scan of the head which was unremarkable. Patient's symptoms persisted and in fact 3 days later she noticed some decreased peripheral vision on the left with seeing spots as well as her taste was quite altered. 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 She underwent carotid ultrasound on 01/13/14 in [place] which shows no significant extra cranial vascular stenosis. Transthoracic echo was done today 01/14/14 which shows normal ejection fraction without cardiac source of embolism. Review of her prior labs in the computer show hemoglobin A1c of 5.8 on 07/30/13 with total cholesterol of 215, LDL 140, HDL 56 and triglycerides 97 mg percent. She has no known history of atrial fibrillation, palpitations or cardiac problems. She has no history of hypertension , smoking and diabetes and stroke in a young age. She has no history of DVT or pulmonary embolism. She was started on aspirin 325 mg but has not been started on statin yet. She has noted improvement in her paresthesias in her arms though she still has some numbness on her left cheek and fingertips. Her vision difficulties seem to have resolved. 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 Transcranial Doppler study on 01/14/2014 was suboptimal due to  poorBITEMPORAL WINDOWS. She is participating in the RESPECT ESUS trial  and is tolerating this study medication without any side effects.  ROS:   14 system review of systems is positive for numbness, tingling,  and all other systems negative  PMH:  Past Medical History  Diagnosis Date  . Uterine prolapse   . Cystocele   . Kidney stones   . High cholesterol   . Acid reflux     Social History:  History   Social History  . Marital Status: Married    Spouse Name: N/A    Number of Children: 2  . Years of Education: MASTERS   Occupational History  . Not on file.   Social History Main Topics  . Smoking status: Never Smoker   . Smokeless tobacco: Never Used  . Alcohol Use: 0.0 oz/week    0 Not specified per week     Comment: Rare  . Drug Use: No  . Sexual Activity: No   Other Topics Concern  . Not on file   Social History Narrative   Patient is married with 2 children.   Patient is right handed.   Patient has her Masters degree.   Patient drinks 2 cups daily.    Medications:   Current Outpatient Prescriptions on File Prior to Visit  Medication Sig Dispense Refill  .  atorvastatin (LIPITOR) 20 MG tablet Take 1 tablet (20 mg total) by mouth daily. 60 tablet 1  . Calcium Carbonate-Vitamin D (CALCIUM + D PO) Take by mouth.    . Multiple Vitamin (MULTIVITAMIN) tablet Take 1 tablet by mouth daily.    . Omega-3 Fatty Acids (FISH OIL PO) Take by mouth.    . triamcinolone (NASACORT AQ) 55 MCG/ACT AERO nasal inhaler Place 2 sprays into the nose daily.    Marland Kitchen. aspirin 325 MG tablet Take 325 mg by mouth daily.     No current facility-administered medications on file prior to visit.    Allergies:   Allergies  Allergen Reactions  . Bee Venom   .  Claritin [Loratadine] Nausea And Vomiting  . Ibuprofen   . Sulfonamide Derivatives     Physical Exam General: well developed, well nourished, seated, in no evident distress Head: head normocephalic and atraumatic.   Neck: supple with no carotid or supraclavicular bruits Cardiovascular: regular rate and rhythm, no murmurs Musculoskeletal: no deformity Skin:  no rash/petichiae Vascular:  Normal pulses all extremities Filed Vitals:   04/05/14 1357  BP: 114/73  Pulse: 86    Neurologic Exam Mental Status: Awake and fully alert. Oriented to place and time. Recent and remote memory intact. Attention span, concentration and fund of knowledge appropriate. Mood and affect appropriate.  Cranial Nerves: Fundoscopic exam not done  . Pupils equal, briskly reactive to light. Extraocular movements full without nystagmus. Visual fields full to confrontation. Hearing intact. Facial sensation intact. Face, tongue, palate moves normally and symmetrically.  Motor: Normal bulk and tone. Normal strength in all tested extremity muscles. Sensory.: intact to touch , pinprick , position and vibratory sensation. Except left cheek and fingertips  Coordination: Rapid alternating movements normal in all extremities. Finger-to-nose and heel-to-shin performed accurately bilaterally. Gait and Station: Arises from chair without difficulty. Stance is normal. Gait demonstrates normal stride length and balance . Able to heel, toe and tandem walk without difficulty.  Reflexes: 1+ and symmetric. Toes downgoing.   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  Marland Kitchen. Vascular risk factors of hyperlipidemia only    PLAN:  I had a long d/w patient about her recent stroke, risk for recurrent stroke/TIAs, personally independently  reviewed imaging studies and stroke evaluation results and answered questions.Continue RESPECT ESUS study medication( Pradaxa versus aspirin ) for secondary stroke prevention and maintain strict control of hypertension with blood pressure goal below 130/90, diabetes with hemoglobin A1c goal below 6.5% and lipids with LDL cholesterol goal below 100 mg/dL. Followup in the future as per study protocol on 05/14/2014    Note: This document was prepared with digital dictation and possible smart phrase technology. Any transcriptional errors that result from this process are unintentional.

## 2014-04-09 ENCOUNTER — Ambulatory Visit: Payer: BC Managed Care – PPO | Admitting: Neurology

## 2014-05-14 ENCOUNTER — Encounter: Payer: Self-pay | Admitting: Neurology

## 2014-05-14 ENCOUNTER — Other Ambulatory Visit: Payer: Self-pay | Admitting: Neurology

## 2014-05-14 ENCOUNTER — Ambulatory Visit (INDEPENDENT_AMBULATORY_CARE_PROVIDER_SITE_OTHER): Payer: Self-pay | Admitting: Neurology

## 2014-05-14 DIAGNOSIS — I6340 Cerebral infarction due to embolism of unspecified cerebral artery: Secondary | ICD-10-CM

## 2014-05-14 DIAGNOSIS — I639 Cerebral infarction, unspecified: Secondary | ICD-10-CM

## 2014-05-14 MED ORDER — ATORVASTATIN CALCIUM 20 MG PO TABS: 20.0000 mg | ORAL_TABLET | Freq: Every day | ORAL | Status: DC

## 2014-05-14 NOTE — Progress Notes (Signed)
Pt was not seen by me today during research follow up visit. However, I refilled her lipitor to her pharmacy.   Marvel PlanJindong Casy Brunetto, MD PhD Stroke Neurology 05/14/2014 12:27 PM

## 2014-08-05 ENCOUNTER — Encounter: Payer: Self-pay | Admitting: Women's Health

## 2014-08-05 ENCOUNTER — Ambulatory Visit (INDEPENDENT_AMBULATORY_CARE_PROVIDER_SITE_OTHER): Payer: BC Managed Care – PPO | Admitting: Women's Health

## 2014-08-05 ENCOUNTER — Other Ambulatory Visit (HOSPITAL_COMMUNITY)
Admission: RE | Admit: 2014-08-05 | Discharge: 2014-08-05 | Disposition: A | Discharge status: Home / Self Care | Payer: BC Managed Care – PPO | Source: Ambulatory Visit | Attending: Women's Health | Admitting: Women's Health

## 2014-08-05 VITALS — BP 132/84 | Ht 63.0 in | Wt 225.0 lb

## 2014-08-05 DIAGNOSIS — Z1151 Encounter for screening for human papillomavirus (HPV): Secondary | ICD-10-CM | POA: Insufficient documentation

## 2014-08-05 DIAGNOSIS — M810 Age-related osteoporosis without current pathological fracture: Secondary | ICD-10-CM | POA: Diagnosis not present

## 2014-08-05 DIAGNOSIS — Z01419 Encounter for gynecological examination (general) (routine) without abnormal findings: Secondary | ICD-10-CM | POA: Insufficient documentation

## 2014-08-05 NOTE — Patient Instructions (Signed)
Health Recommendations for Postmenopausal Women Respected and ongoing research has looked at the most common causes of death, disability, and poor quality of life in postmenopausal women. The causes include heart disease, diseases of blood vessels, diabetes, depression, cancer, and bone loss (osteoporosis). Many things can be done to help lower the chances of developing these and other common problems. CARDIOVASCULAR DISEASE Heart Disease: A heart attack is a medical emergency. Know the signs and symptoms of a heart attack. Below are things women can do to reduce their risk for heart disease.   Do not smoke. If you smoke, quit.  Aim for a healthy weight. Being overweight causes many preventable deaths. Eat a healthy and balanced diet and drink an adequate amount of liquids.  Get moving. Make a commitment to be more physically active. Aim for 30 minutes of activity on most, if not all days of the week.  Eat for heart health. Choose a diet that is low in saturated fat and cholesterol and eliminate trans fat. Include whole grains, vegetables, and fruits. Read and understand the labels on food containers before buying.  Know your numbers. Ask your caregiver to check your blood pressure, cholesterol (total, HDL, LDL, triglycerides) and blood glucose. Work with your caregiver on improving your entire clinical picture.  High blood pressure. Limit or stop your table salt intake (try salt substitute and food seasonings). Avoid salty foods and drinks. Read labels on food containers before buying. Eating well and exercising can help control high blood pressure. STROKE  Stroke is a medical emergency. Stroke may be the result of a blood clot in a blood vessel in the brain or by a brain hemorrhage (bleeding). Know the signs and symptoms of a stroke. To lower the risk of developing a stroke:  Avoid fatty foods.  Quit smoking.  Control your diabetes, blood pressure, and irregular heart rate. THROMBOPHLEBITIS  (BLOOD CLOT) OF THE LEG  Becoming overweight and leading a stationary lifestyle may also contribute to developing blood clots. Controlling your diet and exercising will help lower the risk of developing blood clots. CANCER SCREENING  Breast Cancer: Take steps to reduce your risk of breast cancer.  You should practice "breast self-awareness." This means understanding the normal appearance and feel of your breasts and should include breast self-examination. Any changes detected, no matter how small, should be reported to your caregiver.  After age 40, you should have a clinical breast exam (CBE) every year.  Starting at age 40, you should consider having a mammogram (breast X-ray) every year.  If you have a family history of breast cancer, talk to your caregiver about genetic screening.  If you are at high risk for breast cancer, talk to your caregiver about having an MRI and a mammogram every year.  Intestinal or Stomach Cancer: Tests to consider are a rectal exam, fecal occult blood, sigmoidoscopy, and colonoscopy. Women who are high risk may need to be screened at an earlier age and more often.  Cervical Cancer:  Beginning at age 30, you should have a Pap test every 3 years as long as the past 3 Pap tests have been normal.  If you have had past treatment for cervical cancer or a condition that could lead to cancer, you need Pap tests and screening for cancer for at least 20 years after your treatment.  If you had a hysterectomy for a problem that was not cancer or a condition that could lead to cancer, then you no longer need Pap tests.    If you are between ages 65 and 70, and you have had normal Pap tests going back 10 years, you no longer need Pap tests.  If Pap tests have been discontinued, risk factors (such as a new sexual partner) need to be reassessed to determine if screening should be resumed.  Some medical problems can increase the chance of getting cervical cancer. In these  cases, your caregiver may recommend more frequent screening and Pap tests.  Uterine Cancer: If you have vaginal bleeding after reaching menopause, you should notify your caregiver.  Ovarian Cancer: Other than yearly pelvic exams, there are no reliable tests available to screen for ovarian cancer at this time except for yearly pelvic exams.  Lung Cancer: Yearly chest X-rays can detect lung cancer and should be done on high risk women, such as cigarette smokers and women with chronic lung disease (emphysema).  Skin Cancer: A complete body skin exam should be done at your yearly examination. Avoid overexposure to the sun and ultraviolet light lamps. Use a strong sun block cream when in the sun. All of these things are important for lowering the risk of skin cancer. MENOPAUSE Menopause Symptoms: Hormone therapy products are effective for treating symptoms associated with menopause:  Moderate to severe hot flashes.  Night sweats.  Mood swings.  Headaches.  Tiredness.  Loss of sex drive.  Insomnia.  Other symptoms. Hormone replacement carries certain risks, especially in older women. Women who use or are thinking about using estrogen or estrogen with progestin treatments should discuss that with their caregiver. Your caregiver will help you understand the benefits and risks. The ideal dose of hormone replacement therapy is not known. The Food and Drug Administration (FDA) has concluded that hormone therapy should be used only at the lowest doses and for the shortest amount of time to reach treatment goals.  OSTEOPOROSIS Protecting Against Bone Loss and Preventing Fracture If you use hormone therapy for prevention of bone loss (osteoporosis), the risks for bone loss must outweigh the risk of the therapy. Ask your caregiver about other medications known to be safe and effective for preventing bone loss and fractures. To guard against bone loss or fractures, the following is recommended:  If  you are younger than age 50, take 1000 mg of calcium and at least 600 mg of Vitamin D per day.  If you are older than age 50 but younger than age 70, take 1200 mg of calcium and at least 600 mg of Vitamin D per day.  If you are older than age 70, take 1200 mg of calcium and at least 800 mg of Vitamin D per day. Smoking and excessive alcohol intake increases the risk of osteoporosis. Eat foods rich in calcium and vitamin D and do weight bearing exercises several times a week as your caregiver suggests. DIABETES Diabetes Mellitus: If you have type I or type 2 diabetes, you should keep your blood sugar under control with diet, exercise, and recommended medication. Avoid starchy and fatty foods, and too many sweets. Being overweight can make diabetes control more difficult. COGNITION AND MEMORY Cognition and Memory: Menopausal hormone therapy is not recommended for the prevention of cognitive disorders such as Alzheimer's disease or memory loss.  DEPRESSION  Depression may occur at any age, but it is common in elderly women. This may be because of physical, medical, social (loneliness), or financial problems and needs. If you are experiencing depression because of medical problems and control of symptoms, talk to your caregiver about this. Physical   activity and exercise may help with mood and sleep. Community and volunteer involvement may improve your sense of value and worth. If you have depression and you feel that the problem is getting worse or becoming severe, talk to your caregiver about which treatment options are best for you. ACCIDENTS  Accidents are common and can be serious in elderly woman. Prepare your house to prevent accidents. Eliminate throw rugs, place hand bars in bath, shower, and toilet areas. Avoid wearing high heeled shoes or walking on wet, snowy, and icy areas. Limit or stop driving if you have vision or hearing problems, or if you feel you are unsteady with your movements and  reflexes. HEPATITIS C Hepatitis C is a type of viral infection affecting the liver. It is spread mainly through contact with blood from an infected person. It can be treated, but if left untreated, it can lead to severe liver damage over the years. Many people who are infected do not know that the virus is in their blood. If you are a "baby-boomer", it is recommended that you have one screening test for Hepatitis C. IMMUNIZATIONS  Several immunizations are important to consider having during your senior years, including:   Tetanus, diphtheria, and pertussis booster shot.  Influenza every year before the flu season begins.  Pneumonia vaccine.  Shingles vaccine.  Others, as indicated based on your specific needs. Talk to your caregiver about these. Document Released: 04/13/2005 Document Revised: 07/06/2013 Document Reviewed: 12/08/2007 ExitCare Patient Information 2015 [employer]. This information is not intended to replace advice given to you by your health care provider. Make sure you discuss any questions you have with your health care provider.  

## 2014-08-05 NOTE — Progress Notes (Signed)
Joanna ShutterJanet [name] [birth year] [id number]    History:    Presents for annual exam.  Postmenopausal/no HRT/no bleeding. Normal Pap and mammogram history. Normal DEXA. Has not received Zostavax. Asymptomatic 3+ cystocele. 11/2013 mini stroke/not hospitalized, primary care managing   Past medical history, past surgical history, family history and social history were all reviewed and documented in the EPIC chart. Retired principal. Husband died 08/2013 MI in his sleep. Father diabetes. Parents, sister hypertension.  ROS:  A ROS was performed and pertinent positives and negatives are included.  Exam:  Filed Vitals:   08/05/14 0927  BP: 132/84    General appearance:  Normal Thyroid:  Symmetrical, normal in size, without palpable masses or nodularity. Respiratory  Auscultation:  Clear without wheezing or rhonchi Cardiovascular  Auscultation:  Regular rate, without rubs, murmurs or gallops  Edema/varicosities:  Not grossly evident Abdominal  Soft,nontender, without masses, guarding or rebound.  Liver/spleen:  No organomegaly noted  Hernia:  None appreciated  Skin  Inspection:  Grossly normal   Breasts: Examined lying and sitting.     Right: Without masses, retractions, discharge or axillary adenopathy.     Left: Without masses, retractions, discharge or axillary adenopathy. Gentitourinary   Inguinal/mons:  Normal without inguinal adenopathy  External genitalia:  Normal  BUS/Urethra/Skene's glands:  Normal  Vagina:  +3 cystocele  Cervix:  Normal  Uterus:   normal in size, shape and contour.  Midline and mobile  Adnexa/parametria:     Rt: Without masses or tenderness.   Lt: Without masses or tenderness.  Anus and perineum: Normal  Digital rectal exam: Normal sphincter tone without palpated masses or tenderness  Assessment/Plan:  64 y.o. W WF G2 P2 for annual exam.  Postmenopausal/no HRT/no bleeding +3 asymptomatic cystocele Mini stroke without residual 11/2013-neurologist  managing Primary care-labs and meds  Plan: SBE's, continue annual screening mammogram, calcium rich diet, vitamin D 1000 daily encouraged. Instructed to have vitamin D level checked at primary care with next blood draw. History of normal DEXA, will repeat. Home safety, fall prevention and importance of regular weightbearing exercise reviewed. Encouraged to decrease calories for weight loss. UA, Pap with HR HPV typing, new screening guidelines reviewed. Again reviewed importance of screening colonoscopy, declines.   Harrington ChallengerYOUNG,NANCY J [facility] ServicesWHNP, 10:43 AM 08/05/2014

## 2014-08-06 LAB — URINALYSIS W MICROSCOPIC + REFLEX CULTURE
BILIRUBIN URINE: NEGATIVE
Bacteria, UA: NONE SEEN
Casts: NONE SEEN
Crystals: NONE SEEN
GLUCOSE, UA: NEGATIVE mg/dL
HGB URINE DIPSTICK: NEGATIVE
Ketones, ur: NEGATIVE mg/dL
Nitrite: NEGATIVE
PH: 6.5 (ref 5.0–8.0)
PROTEIN: NEGATIVE mg/dL
Squamous Epithelial / LPF: NONE SEEN
Urobilinogen, UA: 0.2 mg/dL (ref 0.0–1.0)

## 2014-08-06 LAB — CYTOLOGY - PAP

## 2014-08-08 LAB — URINE CULTURE: Colony Count: >=100000

## 2014-08-09 ENCOUNTER — Other Ambulatory Visit: Payer: Self-pay | Admitting: Women's Health

## 2014-08-09 DIAGNOSIS — R8271 Bacteriuria: Secondary | ICD-10-CM

## 2014-08-13 ENCOUNTER — Other Ambulatory Visit: Payer: Self-pay | Admitting: Gynecology

## 2014-08-13 ENCOUNTER — Encounter (INDEPENDENT_AMBULATORY_CARE_PROVIDER_SITE_OTHER): Payer: Self-pay

## 2014-08-13 DIAGNOSIS — M810 Age-related osteoporosis without current pathological fracture: Secondary | ICD-10-CM

## 2014-08-13 DIAGNOSIS — Z0289 Encounter for other administrative examinations: Secondary | ICD-10-CM

## 2014-09-03 DIAGNOSIS — M8580 Other specified disorders of bone density and structure, unspecified site: Secondary | ICD-10-CM

## 2014-09-03 HISTORY — DX: Other specified disorders of bone density and structure, unspecified site: M85.80

## 2014-09-14 ENCOUNTER — Ambulatory Visit (INDEPENDENT_AMBULATORY_CARE_PROVIDER_SITE_OTHER): Payer: BC Managed Care – PPO

## 2014-09-14 ENCOUNTER — Other Ambulatory Visit: Payer: BC Managed Care – PPO

## 2014-09-14 ENCOUNTER — Other Ambulatory Visit: Payer: Self-pay | Admitting: Gynecology

## 2014-09-14 DIAGNOSIS — M8580 Other specified disorders of bone density and structure, unspecified site: Secondary | ICD-10-CM | POA: Diagnosis not present

## 2014-09-14 DIAGNOSIS — R8271 Bacteriuria: Secondary | ICD-10-CM

## 2014-09-14 DIAGNOSIS — Z13820 Encounter for screening for osteoporosis: Secondary | ICD-10-CM

## 2014-09-14 DIAGNOSIS — M810 Age-related osteoporosis without current pathological fracture: Secondary | ICD-10-CM

## 2014-09-15 ENCOUNTER — Encounter: Payer: Self-pay | Admitting: Gynecology

## 2014-09-15 LAB — URINALYSIS W MICROSCOPIC + REFLEX CULTURE
Bacteria, UA: NONE SEEN
Bilirubin Urine: NEGATIVE
Casts: NONE SEEN
Crystals: NONE SEEN
GLUCOSE, UA: NEGATIVE mg/dL
Hgb urine dipstick: NEGATIVE
KETONES UR: NEGATIVE mg/dL
NITRITE: NEGATIVE
Protein, ur: NEGATIVE mg/dL
Specific Gravity, Urine: <1.005 — ABNORMAL LOW (ref 1.005–1.030)
Squamous Epithelial / LPF: NONE SEEN
Urobilinogen, UA: 0.2 mg/dL (ref 0.0–1.0)
pH: 7 (ref 5.0–8.0)

## 2014-09-19 LAB — URINE CULTURE: Colony Count: 75000

## 2014-10-04 ENCOUNTER — Telehealth: Payer: Self-pay | Admitting: *Deleted

## 2014-10-04 MED ORDER — NITROFURANTOIN MONOHYD MACRO 100 MG PO CAPS: 100.0000 mg | ORAL_CAPSULE | Freq: Two times a day (BID) | ORAL | Status: DC

## 2014-10-04 NOTE — Telephone Encounter (Signed)
(  You are back up MD) pt called c/o burning with urination, urgency had chills over the weekend with low grade fever, no fever or chills now. Still has burning with urination and urgency. OV offered to pt, she is aware nancy is off today asked if you could sent her Rx to pharmacy? Please advise

## 2014-10-04 NOTE — Telephone Encounter (Signed)
Pt aware rx sent.  

## 2014-10-04 NOTE — Telephone Encounter (Signed)
Macrobid 100 mg twice a day 7 days 

## 2014-11-09 ENCOUNTER — Encounter (INDEPENDENT_AMBULATORY_CARE_PROVIDER_SITE_OTHER): Payer: Self-pay

## 2014-11-09 DIAGNOSIS — Z0289 Encounter for other administrative examinations: Secondary | ICD-10-CM

## 2014-12-30 NOTE — Telephone Encounter (Signed)
Error

## 2015-02-17 ENCOUNTER — Encounter (INDEPENDENT_AMBULATORY_CARE_PROVIDER_SITE_OTHER): Payer: Self-pay

## 2015-02-17 DIAGNOSIS — Z0289 Encounter for other administrative examinations: Secondary | ICD-10-CM

## 2015-02-24 ENCOUNTER — Telehealth: Payer: Self-pay

## 2015-02-24 NOTE — Telephone Encounter (Signed)
Called and asked if still taking Celecoxib, Subject said" yes" . She is taking for joint pain for a long time. Even before starting the study.

## 2015-05-18 ENCOUNTER — Telehealth: Payer: Self-pay

## 2015-05-18 NOTE — Telephone Encounter (Signed)
Spoke to the subject Re: Visit 7 for R ESUS trial

## 2015-07-14 IMAGING — CT CT ANGIO HEAD
3 of 11 series · 18 of 47 positions shown · IV contrast (APPLIED)
Comparison: MRI 01/12/2014

CLINICAL DATA: Recent cerebral infarction.  Blurred vision.

EXAM:
CT ANGIOGRAPHY HEAD
TECHNIQUE: Multidetector CT imaging of the head was performed using the
standard protocol during bolus administration of intravenous
contrast. Multiplanar CT image reconstructions and MIPs were
obtained to evaluate the vascular anatomy.
CONTRAST:  50mL OMNIPAQUE IOHEXOL 350 MG/ML SOLN

[Series 8: thin axial · axial · 0.45mm/px · z∈[+1514,+1643]mm · 12 of 153 slices shown]
[im 12/153  brain]
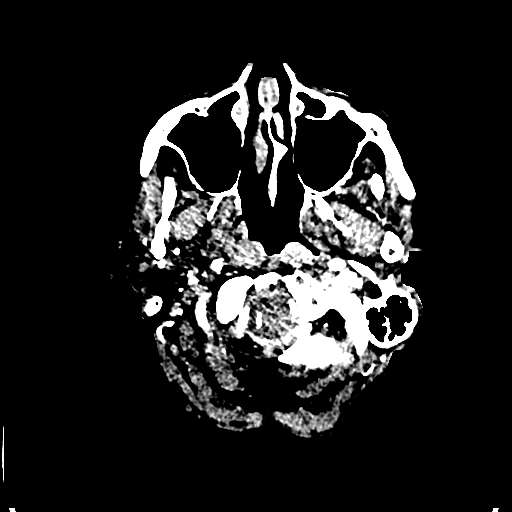
[im 24/153  bone]
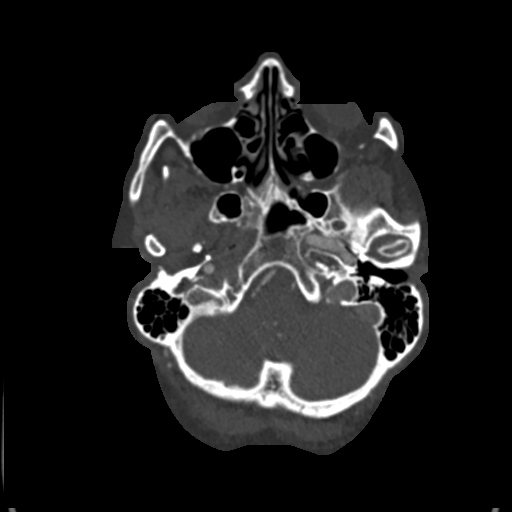
[im 36/153  brain]
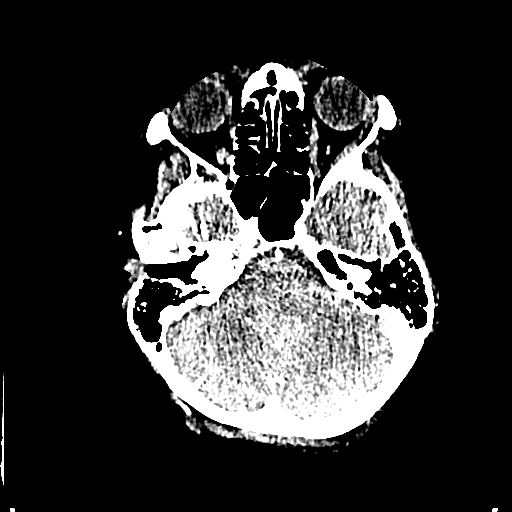
[im 47/153  bone]
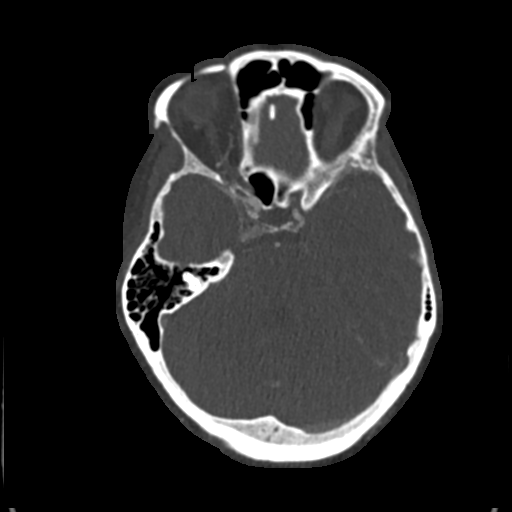
[im 59/153  brain]
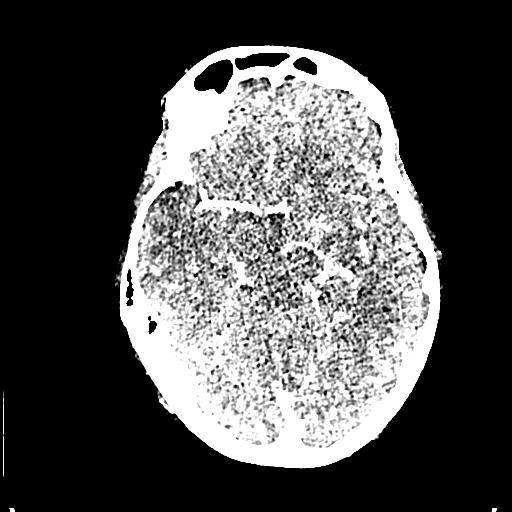
[im 71/153  bone]
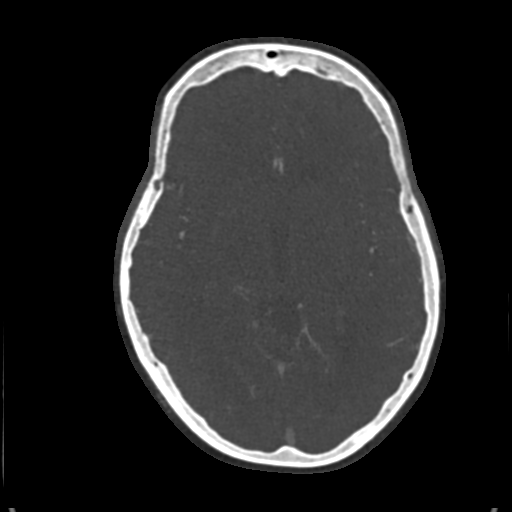
[im 82/153  brain]
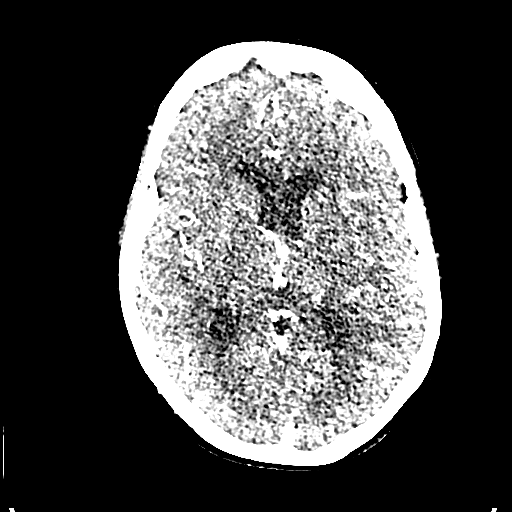
[im 94/153  bone]
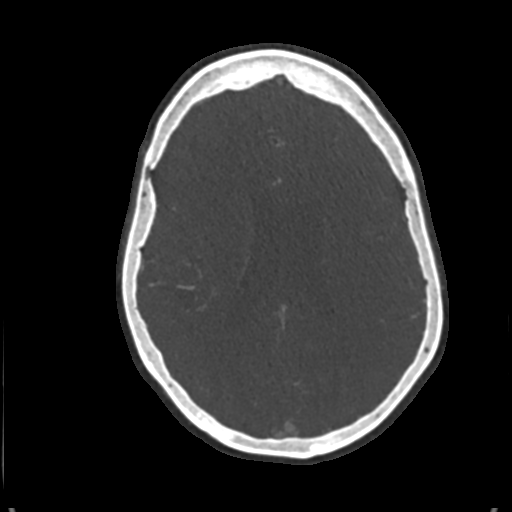
[im 106/153  brain]
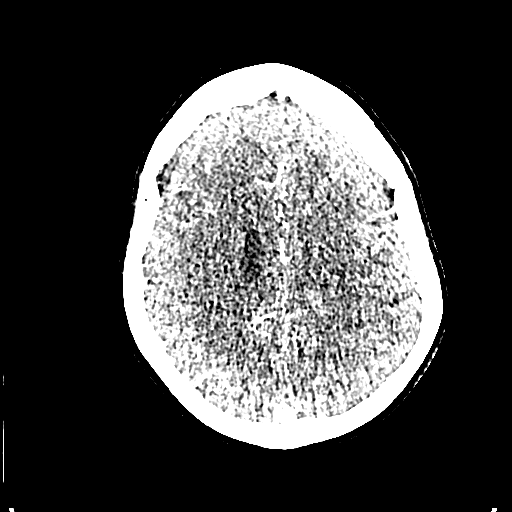
[im 117/153  bone]
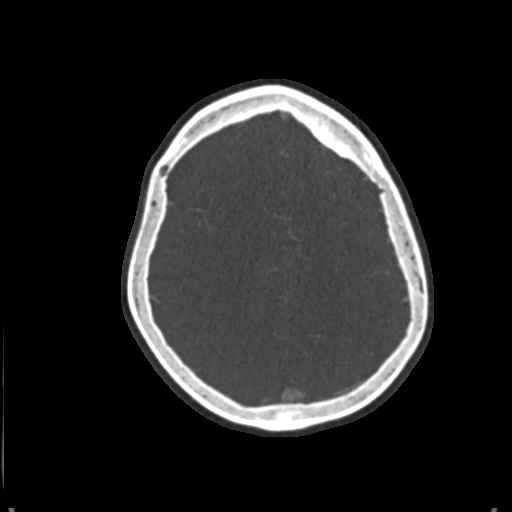
[im 129/153  brain]
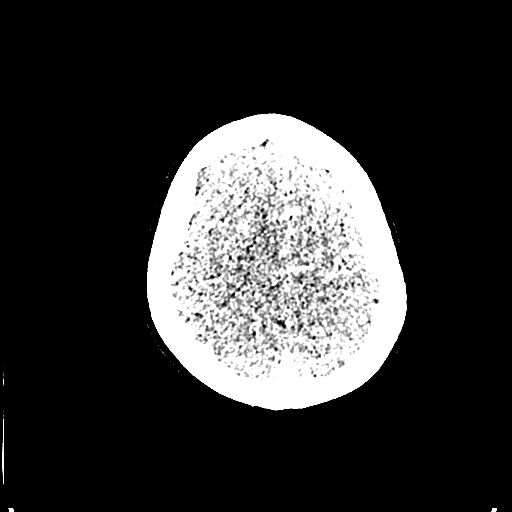
[im 141/153  bone]
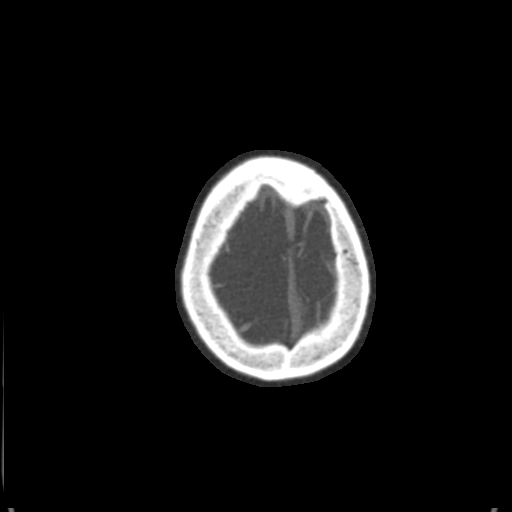

[Series 9: coronal thin · coronal · 0.32mm/px · 3 of 204 slices shown]
[im 59/204  brain]
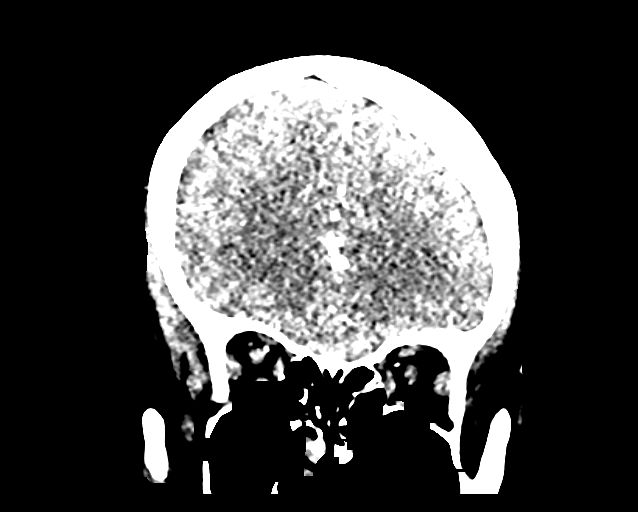
[im 88/204  brain]
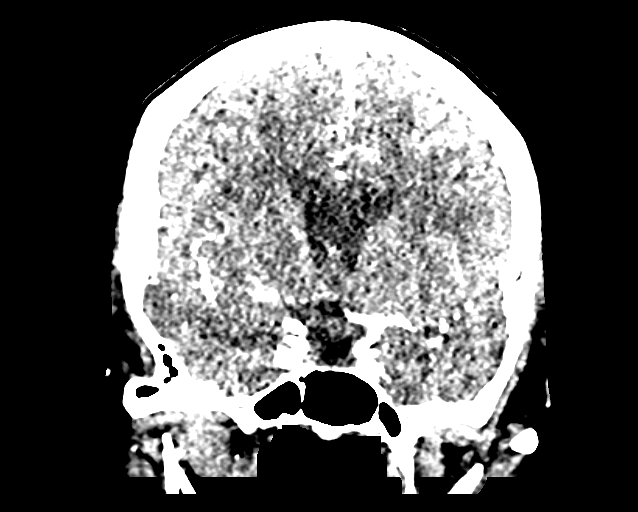
[im 117/204  brain]
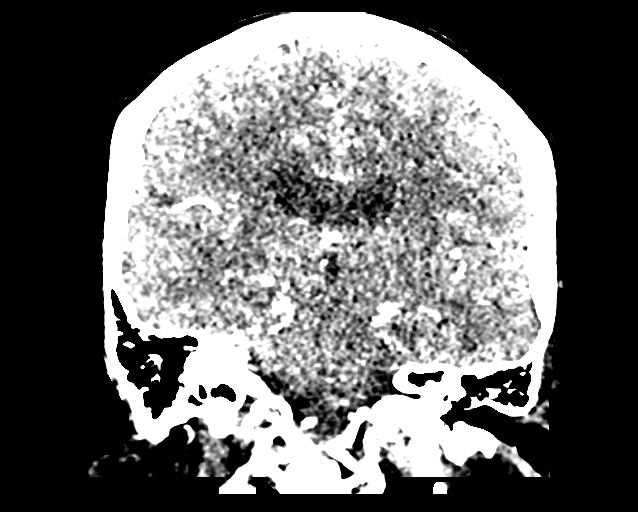

[Series 11: sagittal thin · sagittal · 0.36mm/px · 3 of 160 slices shown]
[im 32/160  brain]
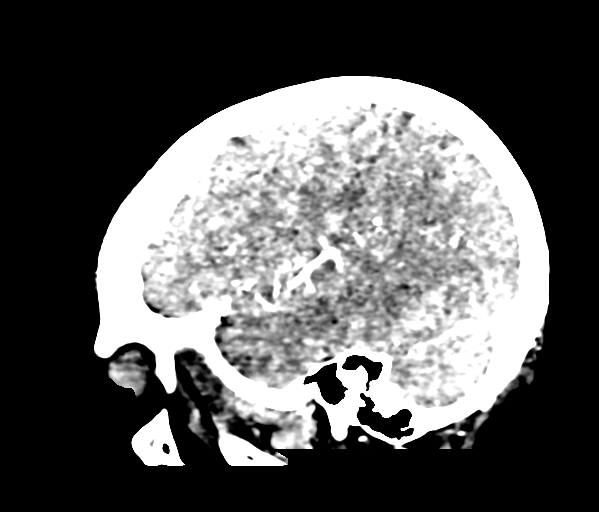
[im 64/160  brain]
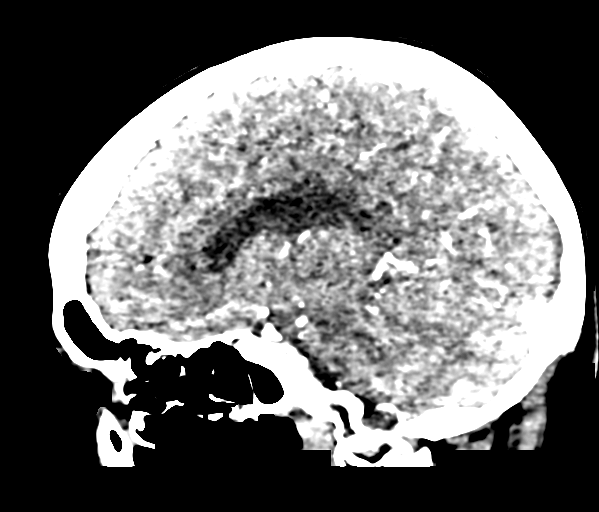
[im 96/160  brain]
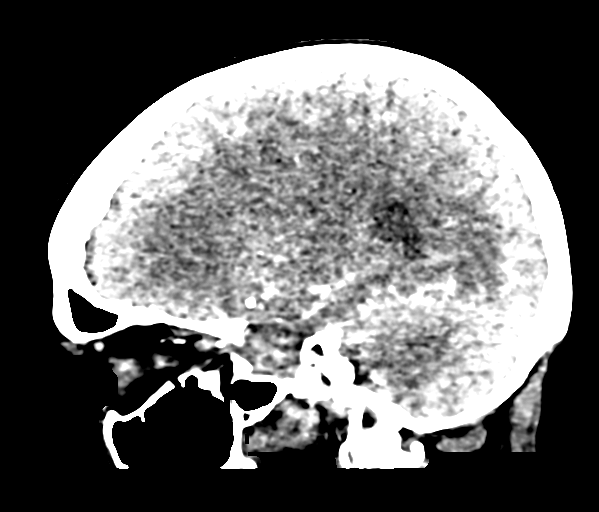

[18 of 47 positions shown; findings below may reference images not displayed]

FINDINGS: Ventricle size is normal. Negative for acute infarct. Previously
identified small acute infarcts in the right thalamus and right
occipital lobe not seen on CT. Negative for hemorrhage or mass.

Both vertebral arteries are patent to the basilar. PICA patent
bilaterally. Basilar widely patent. AICA and superior cerebellar
arteries are patent bilaterally. Left posterior cerebral artery
widely patent

Occlusion of the proximal right posterior cerebral artery with
reconstitution distally. This accounts for the acute infarcts in the
right thalamus and right occipital lobe.

Internal carotid artery widely patent bilaterally. Anterior and
middle cerebral arteries widely patent.

Negative for cerebral aneurysm. Sagittal sinus patent. Left
transverse sinus and sigmoid sinus patent. Hypoplastic versus
occluded right transverse sinus with a small sigmoid sinus and small
jugular vein on the right. This appears to be a long-standing
abnormality. No filling defect in the vein.

Review of the MIP images confirms the above findings.
IMPRESSION: Occlusion of the proximal right posterior cerebral artery with
distal reconstitution accounting for the area of acute infarction in
the right thalamus and right occipital lobe on recent MRI.

Right transverse sinus is hypoplastic or occluded. This is likely a
long-standing finding.

## 2015-10-19 ENCOUNTER — Encounter: Payer: BC Managed Care – PPO | Admitting: Women's Health

## 2015-11-08 ENCOUNTER — Encounter: Payer: BC Managed Care – PPO | Admitting: Women's Health

## 2015-11-15 ENCOUNTER — Encounter: Payer: BC Managed Care – PPO | Admitting: Women's Health

## 2015-12-29 ENCOUNTER — Encounter: Payer: BC Managed Care – PPO | Admitting: Women's Health

## 2016-01-03 ENCOUNTER — Encounter: Payer: Self-pay | Admitting: Women's Health

## 2016-01-03 ENCOUNTER — Ambulatory Visit (INDEPENDENT_AMBULATORY_CARE_PROVIDER_SITE_OTHER): Payer: Medicare Other | Admitting: Women's Health

## 2016-01-03 VITALS — BP 128/80 | Ht 63.0 in | Wt 234.0 lb

## 2016-01-03 DIAGNOSIS — Z01411 Encounter for gynecological examination (general) (routine) with abnormal findings: Secondary | ICD-10-CM

## 2016-01-03 DIAGNOSIS — Z23 Encounter for immunization: Secondary | ICD-10-CM | POA: Diagnosis not present

## 2016-01-03 NOTE — Patient Instructions (Addendum)
Menopause is a normal process in which your reproductive ability comes to an end. This process happens gradually over a span of months to years, usually between the ages of 48 and 55. Menopause is complete when you have missed 12 consecutive menstrual periods. It is important to talk with your health care provider about some of the most common conditions that affect postmenopausal women, such as heart disease, cancer, and bone loss (osteoporosis). Adopting a healthy lifestyle and getting preventive care can help to promote your health and wellness. Those actions can also lower your chances of developing some of these common conditions. WHAT SHOULD I KNOW ABOUT MENOPAUSE? During menopause, you may experience a number of symptoms, such as:  Moderate-to-severe hot flashes.  Night sweats.  Decrease in sex drive.  Mood swings.  Headaches.  Tiredness.  Irritability.  Memory problems.  Insomnia. Choosing to treat or not to treat menopausal changes is an individual decision that you make with your health care provider. WHAT SHOULD I KNOW ABOUT HORMONE REPLACEMENT THERAPY AND SUPPLEMENTS? Hormone therapy products are effective for treating symptoms that are associated with menopause, such as hot flashes and night sweats. Hormone replacement carries certain risks, especially as you become older. If you are thinking about using estrogen or estrogen with progestin treatments, discuss the benefits and risks with your health care provider. WHAT SHOULD I KNOW ABOUT HEART DISEASE AND STROKE? Heart disease, heart attack, and stroke become more likely as you age. This may be due, in part, to the hormonal changes that your body experiences during menopause. These can affect how your body processes dietary fats, triglycerides, and cholesterol. Heart attack and stroke are both medical emergencies. There are many things that you can do to help prevent heart disease and stroke:  Have your blood pressure  checked at least every 1-2 years. High blood pressure causes heart disease and increases the risk of stroke.  If you are 55-79 years old, ask your health care provider if you should take aspirin to prevent a heart attack or a stroke.  Do not use any tobacco products, including cigarettes, chewing tobacco, or electronic cigarettes. If you need help quitting, ask your health care provider.  It is important to eat a healthy diet and maintain a healthy weight.  Be sure to include plenty of vegetables, fruits, low-fat dairy products, and lean protein.  Avoid eating foods that are high in solid fats, added sugars, or salt (sodium).  Get regular exercise. This is one of the most important things that you can do for your health.  Try to exercise for at least 150 minutes each week. The type of exercise that you do should increase your heart rate and make you sweat. This is known as moderate-intensity exercise.  Try to do strengthening exercises at least twice each week. Do these in addition to the moderate-intensity exercise.  Know your numbers.Ask your health care provider to check your cholesterol and your blood glucose. Continue to have your blood tested as directed by your health care provider. WHAT SHOULD I KNOW ABOUT CANCER SCREENING? There are several types of cancer. Take the following steps to reduce your risk and to catch any cancer development as early as possible. Breast Cancer  Practice breast self-awareness.  This means understanding how your breasts normally appear and feel.  It also means doing regular breast self-exams. Let your health care provider know about any changes, no matter how small.  If you are 40 or older, have a clinician do a   breast exam (clinical breast exam or CBE) every year. Depending on your age, family history, and medical history, it may be recommended that you also have a yearly breast X-ray (mammogram).  If you have a family history of breast cancer,  talk with your health care provider about genetic screening.  If you are at high risk for breast cancer, talk with your health care provider about having an MRI and a mammogram every year.  Breast cancer (BRCA) gene test is recommended for women who have family members with BRCA-related cancers. Results of the assessment will determine the need for genetic counseling and BRCA1 and for BRCA2 testing. BRCA-related cancers include these types:  Breast. This occurs in males or females.  Ovarian.  Tubal. This may also be called fallopian tube cancer.  Cancer of the abdominal or pelvic lining (peritoneal cancer).  Prostate.  Pancreatic. Cervical, Uterine, and Ovarian Cancer Your health care provider may recommend that you be screened regularly for cancer of the pelvic organs. These include your ovaries, uterus, and vagina. This screening involves a pelvic exam, which includes checking for microscopic changes to the surface of your cervix (Pap test).  For women ages 21-65, health care providers may recommend a pelvic exam and a Pap test every three years. For women ages 77-65, they may recommend the Pap test and pelvic exam, combined with testing for human papilloma virus (HPV), every five years. Some types of HPV increase your risk of cervical cancer. Testing for HPV may also be done on women of any age who have unclear Pap test results.  Other health care providers may not recommend any screening for nonpregnant women who are considered low risk for pelvic cancer and have no symptoms. Ask your health care provider if a screening pelvic exam is right for you.  If you have had past treatment for cervical cancer or a condition that could lead to cancer, you need Pap tests and screening for cancer for at least 20 years after your treatment. If Pap tests have been discontinued for you, your risk factors (such as having a new sexual partner) need to be reassessed to determine if you should start having  screenings again. Some women have medical problems that increase the chance of getting cervical cancer. In these cases, your health care provider may recommend that you have screening and Pap tests more often.  If you have a family history of uterine cancer or ovarian cancer, talk with your health care provider about genetic screening.  If you have vaginal bleeding after reaching menopause, tell your health care provider.  There are currently no reliable tests available to screen for ovarian cancer. Lung Cancer Lung cancer screening is recommended for adults 3-70 years old who are at high risk for lung cancer because of a history of smoking. A yearly low-dose CT scan of the lungs is recommended if you:  Currently smoke.  Have a history of at least 30 pack-years of smoking and you currently smoke or have quit within the past 15 years. A pack-year is smoking an average of one pack of cigarettes per day for one year. Yearly screening should:  Continue until it has been 15 years since you quit.  Stop if you develop a health problem that would prevent you from having lung cancer treatment. Colorectal Cancer  This type of cancer can be detected and can often be prevented.  Routine colorectal cancer screening usually begins at age 38 and continues through age 12.  If you have  risk factors for colon cancer, your health care provider may recommend that you be screened at an earlier age.  If you have a family history of colorectal cancer, talk with your health care provider about genetic screening.  Your health care provider may also recommend using home test kits to check for hidden blood in your stool.  A small camera at the end of a tube can be used to examine your colon directly (sigmoidoscopy or colonoscopy). This is done to check for the earliest forms of colorectal cancer.  Direct examination of the colon should be repeated every 5-10 years until age 67. However, if early forms of  precancerous polyps or small growths are found or if you have a family history or genetic risk for colorectal cancer, you may need to be screened more often. Skin Cancer  Check your skin from head to toe regularly.  Monitor any moles. Be sure to tell your health care provider:  About any new moles or changes in moles, especially if there is a change in a mole's shape or color.  If you have a mole that is larger than the size of a pencil eraser.  If any of your family members has a history of skin cancer, especially at a young age, talk with your health care provider about genetic screening.  Always use sunscreen. Apply sunscreen liberally and repeatedly throughout the day.  Whenever you are outside, protect yourself by wearing long sleeves, pants, a wide-brimmed hat, and sunglasses. WHAT SHOULD I KNOW ABOUT OSTEOPOROSIS? Osteoporosis is a condition in which bone destruction happens more quickly than new bone creation. After menopause, you may be at an increased risk for osteoporosis. To help prevent osteoporosis or the bone fractures that can happen because of osteoporosis, the following is recommended:  If you are 39-61 years old, get at least 1,000 mg of calcium and at least 600 mg of vitamin D per day.  If you are older than age 16 but younger than age 7, get at least 1,200 mg of calcium and at least 600 mg of vitamin D per day.  If you are older than age 47, get at least 1,200 mg of calcium and at least 800 mg of vitamin D per day. Smoking and excessive alcohol intake increase the risk of osteoporosis. Eat foods that are rich in calcium and vitamin D, and do weight-bearing exercises several times each week as directed by your health care provider. WHAT SHOULD I KNOW ABOUT HOW MENOPAUSE AFFECTS Joanna Good? Depression may occur at any age, but it is more common as you become older. Common symptoms of depression include:  Low or sad mood.  Changes in sleep patterns.  Changes  in appetite or eating patterns.  Feeling an overall lack of motivation or enjoyment of activities that you previously enjoyed.  Frequent crying spells. Talk with your health care provider if you think that you are experiencing depression. WHAT SHOULD I KNOW ABOUT IMMUNIZATIONS? It is important that you get and maintain your immunizations. These include:  Tetanus, diphtheria, and pertussis (Tdap) booster vaccine.  Influenza every year before the flu season begins.  Pneumonia vaccine.  Shingles vaccine. Your health care provider may also recommend other immunizations.   This information is not intended to replace advice given to you by your health care provider. Make sure you discuss any questions you have with your health care provider.   Document Released: 04/13/2005 Document Revised: 03/12/2014 Document Reviewed: 10/22/2013 Elsevier Interactive Patient Education 2016 Elsevier  Inc. Basic Carbohydrate Counting  for Diabetes Mellitus Carbohydrate counting is a method for keeping track of the amount of carbohydrates you eat. Eating carbohydrates naturally increases the level of sugar (glucose) in your blood, so it is important for you to know the amount that is okay for you to have in every meal. Carbohydrate counting helps keep the level of glucose in your blood within normal limits. The amount of carbohydrates allowed is different for every person. A dietitian can help you calculate the amount that is right for you. Once you know the amount of carbohydrates you can have, you can count the carbohydrates in the foods you want to eat. Carbohydrates are found in the following foods:  Grains, such as breads and cereals.  Dried beans and soy products.  Starchy vegetables, such as potatoes, peas, and corn.  Fruit and fruit juices.  Milk and yogurt.  Sweets and snack foods, such as cake, cookies, candy, chips, soft drinks, and fruit drinks. CARBOHYDRATE COUNTING There are two ways to  count the carbohydrates in your food. You can use either of the methods or a combination of both. Reading the "Nutrition Facts" on Packaged Food The "Nutrition Facts" is an area that is included on the labels of almost all packaged food and beverages in the [place]. It includes the serving size of that food or beverage and information about the nutrients in each serving of the food, including the grams (g) of carbohydrate per serving.  Decide the number of servings of this food or beverage that you will be able to eat or drink. Multiply that number of servings by the number of grams of carbohydrate that is listed on the label for that serving. The total will be the amount of carbohydrates you will be having when you eat or drink this food or beverage. Learning Standard Serving Sizes of Food When you eat food that is not packaged or does not include "Nutrition Facts" on the label, you need to measure the servings in order to count the amount of carbohydrates.A serving of most carbohydrate-rich foods contains about 15 g of carbohydrates. The following list includes serving sizes of carbohydrate-rich foods that provide 15 g ofcarbohydrate per serving:   1 slice of bread (1 oz) or 1 six-inch tortilla.    of a hamburger bun or English muffin.  4-6 crackers.   cup unsweetened dry cereal.    cup hot cereal.   cup rice or pasta.    cup mashed potatoes or  of a large baked potato.  1 cup fresh fruit or one small piece of fruit.    cup canned or frozen fruit or fruit juice.  1 cup milk.   cup plain fat-free yogurt or yogurt sweetened with artificial sweeteners.   cup cooked dried beans or starchy vegetable, such as peas, corn, or potatoes.  Decide the number of standard-size servings that you will eat. Multiply that number of servings by 15 (the grams of carbohydrates in that serving). For example, if you eat 2 cups of strawberries, you will have eaten 2 servings and 30 g of  carbohydrates (2 servings x 15 g = 30 g). For foods such as soups and casseroles, in which more than one food is mixed in, you will need to count the carbohydrates in each food that is included. EXAMPLE OF CARBOHYDRATE COUNTING Sample Dinner  3 oz chicken breast.   cup of brown rice.   cup of corn.  1 cup milk.   1  cup strawberries with sugar-free whipped topping.  Carbohydrate Calculation Step 1: Identify the foods that contain carbohydrates:   Rice.   Corn.   Milk.   Strawberries. Step 2:Calculate the number of servings eaten of each:   2 servings of rice.   1 serving of corn.   1 serving of milk.   1 serving of strawberries. Step 3: Multiply each of those number of servings by 15 g:   2 servings of rice x 15 g = 30 g.   1 serving of corn x 15 g = 15 g.   1 serving of milk x 15 g = 15 g.   1 serving of strawberries x 15 g = 15 g. Step 4: Add together all of the amounts to find the total grams of carbohydrates eaten: 30 g + 15 g + 15 g + 15 g = 75 g.   This information is not intended to replace advice given to you by your health care provider. Make sure you discuss any questions you have with your health care provider.  [facility]  Dr Carlean Purl [phone]  Shingles/zostavac, pnuemonia vaccine  Document Released: 02/19/2005 Document Revised: 03/12/2014 Document Reviewed: 01/16/2013 Elsevier Interactive Patient Education [employer].

## 2016-01-03 NOTE — Addendum Note (Signed)
Addended by: Kem ParkinsonBARNES, Anoushka Divito on: 01/03/2016 03:05 PM   Modules accepted: Orders

## 2016-01-03 NOTE — Progress Notes (Signed)
Marin ShutterJanet T Palau 1950-07-06 [id number]    History:    Presents for annual GYN exam.  Postmenopausal on no HRT with no bleeding. Normal Pap and mammogram history. History of an asymptomatic was 3 cystocele. Mini stroke 2015 without hospitalization. 2016 DEXA T score -2.3 forearm normal all other sites FRAX 7.2%/0.4%. Has not had a screening colonoscopy, Zostavax or Pneumovax.  Past medical history, past surgical history, family history and social history were all reviewed and documented in the EPIC chart. 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 Father diabetes, parents and sister hypertension. [place] this past [place]  ROS:  A ROS was performed and pertinent positives and negatives are included.  Exam:  Vitals:   01/03/16 1403  BP: 128/80  Weight: 234 lb (106.1 kg)  Height: 5\' 3"  (1.6 m)   Body mass index is 41.45 kg/m.   General appearance:  Normal Thyroid:  Symmetrical, normal in size, without palpable masses or nodularity. Respiratory  Auscultation:  Clear without wheezing or rhonchi Cardiovascular  Auscultation:  Regular rate, without rubs, murmurs or gallops  Edema/varicosities:  Not grossly evident Abdominal  Soft,nontender, without masses, guarding or rebound.  Liver/spleen:  No organomegaly noted  Hernia:  None appreciated  Skin  Inspection:  Grossly normal   Breasts: Examined lying and sitting.     Right: Without masses, retractions, discharge or axillary adenopathy.     Left: Without masses, retractions, discharge or axillary adenopathy. Gentitourinary   Inguinal/mons:  Normal without inguinal adenopathy  External genitalia:  Normal  BUS/Urethra/Skene's glands:  Normal  Vagina:  +3 asymptomatic cystocele  Cervix:  Normal  Uterus:   normal in size, shape and contour.  Midline and mobile  Adnexa/parametria:     Rt: Without masses or tenderness.   Lt: Without masses or tenderness.  Anus and  perineum: Normal  Digital rectal exam: Normal sphincter tone without palpated masses or tenderness  Assessment/Plan:  65 y.o. W WF G2 P2 for breast and pelvic exam with no complaints.  Postmenopausal/no bleeding/no HRT +3 cystocele asymptomatic Stroke 2015-neurologist manages Osteopenia without elevated FRAX  Plan: T dap given expecting grandchild. SBE's, continue annual screening mammogram overdue instructed to schedule. Continue silver sneakers, exercise, calcium rich diet, vitamin D 2000 daily encouraged. Home safety, fall prevention and importance of weightbearing exercise reviewed. Declines any treatment for cystocele. Zostavax and Pneumovax recommended. UA, Pap normal 2016, new screening guidelines reviewed.   Harrington ChallengerYOUNG,NANCY J [facility] HospitalWHNP, 2:32 PM 01/03/2016

## 2016-06-08 ENCOUNTER — Ambulatory Visit (HOSPITAL_COMMUNITY)
Admission: RE | Admit: 2016-06-08 | Discharge: 2016-06-08 | Disposition: A | Discharge status: Home / Self Care | Payer: Medicare Other | Source: Ambulatory Visit | Attending: Adult Health Nurse Practitioner | Admitting: Adult Health Nurse Practitioner

## 2016-06-08 ENCOUNTER — Other Ambulatory Visit (HOSPITAL_COMMUNITY): Payer: Self-pay | Admitting: Adult Health Nurse Practitioner

## 2016-06-08 DIAGNOSIS — M25562 Pain in left knee: Secondary | ICD-10-CM | POA: Diagnosis present

## 2016-06-08 DIAGNOSIS — M25762 Osteophyte, left knee: Secondary | ICD-10-CM | POA: Diagnosis not present

## 2016-06-08 DIAGNOSIS — M1712 Unilateral primary osteoarthritis, left knee: Secondary | ICD-10-CM | POA: Insufficient documentation

## 2016-06-08 DIAGNOSIS — M25462 Effusion, left knee: Secondary | ICD-10-CM | POA: Insufficient documentation

## 2016-06-18 ENCOUNTER — Ambulatory Visit: Payer: BC Managed Care – PPO | Admitting: Orthopedic Surgery

## 2016-07-17 ENCOUNTER — Encounter (INDEPENDENT_AMBULATORY_CARE_PROVIDER_SITE_OTHER): Payer: Self-pay | Admitting: Neurology

## 2016-07-17 DIAGNOSIS — I63431 Cerebral infarction due to embolism of right posterior cerebral artery: Secondary | ICD-10-CM

## 2016-07-17 MED ORDER — ASPIRIN EC 325 MG PO TBEC: 325.0000 mg | DELAYED_RELEASE_TABLET | Freq: Every day | ORAL | 0 refills | Status: DC

## 2016-07-17 NOTE — Progress Notes (Signed)
Pt saw today in research department for end of study visit. She continued on investigational meds until this am. Neuro and general exam normal except subjective decreased sensation at left cheek and first two digits of left hand. Will continue her on ASA EC 325mg  daily OTC and then follow up with Dr. Pearlean BrownieSethi one more time in 3 months. Pt expressed understanding.   Marvel PlanJindong Alika Eppes, MD PhD Stroke Neurology 07/17/2016 1:55 PM  Meds ordered this encounter  Medications  . aspirin EC 325 MG tablet    Sig: Take 1 tablet (325 mg total) by mouth daily.    Dispense:  30 tablet    Refill:  0

## 2016-10-02 ENCOUNTER — Ambulatory Visit: Payer: Medicare Other | Admitting: Neurology

## 2016-10-03 ENCOUNTER — Encounter: Payer: Self-pay | Admitting: Neurology

## 2016-10-03 ENCOUNTER — Encounter (INDEPENDENT_AMBULATORY_CARE_PROVIDER_SITE_OTHER): Payer: Self-pay

## 2016-10-03 ENCOUNTER — Ambulatory Visit (INDEPENDENT_AMBULATORY_CARE_PROVIDER_SITE_OTHER): Payer: Medicare Other | Admitting: Neurology

## 2016-10-03 VITALS — BP 132/79 | HR 82 | Ht 63.0 in | Wt 244.6 lb

## 2016-10-03 DIAGNOSIS — I6529 Occlusion and stenosis of unspecified carotid artery: Secondary | ICD-10-CM

## 2016-10-03 DIAGNOSIS — I6990 Unspecified sequelae of unspecified cerebrovascular disease: Secondary | ICD-10-CM

## 2016-10-03 DIAGNOSIS — Z6841 Body Mass Index (BMI) 40.0 and over, adult: Secondary | ICD-10-CM

## 2016-10-03 NOTE — Progress Notes (Signed)
[facility] [address] [place]. [place] [address] [phone]       OFFICE FOLLOW UP VISIT NOTE  [name] Date of Birth:  01/24/1951 Medical Record Number:  [id number]   Referring MD: Zach Hall Reason for Referral:  Stroke  Initial Consult 01/14/2014: 66 year pleasant Caucasian lady who on 01/05/14 noted  slight tingling involving the left lip  but ignored it. Next morning she woke up with the tingling involving the left cheek as well as the leg and later on involving the left hand as well. She describes warmth-like sensation in the left side. She saw Dr. Hall who ordered a CT scan of the head which was unremarkable. Patient's symptoms persisted and in fact 3 days later she noticed some decreased peripheral vision on the left with seeing spots as well as her taste was quite altered. 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 She underwent carotid ultrasound on 01/13/14 in [place] which shows no significant extra cranial vascular stenosis. Transthoracic echo was done today 01/14/14 which shows normal ejection fraction without cardiac source of embolism. Review of her prior labs in the computer show hemoglobin A1c of 5.8 on 07/30/13 with total cholesterol of 215, LDL 140, HDL 56 and triglycerides 97 mg percent. She has no known history of atrial fibrillation, palpitations or cardiac problems. She has no history of hypertension , smoking and diabetes and stroke in a young age. She has no history of DVT or pulmonary embolism. She was started on aspirin 325 mg but has not been started on statin yet. She has noted improvement in her paresthesias in her arms though she still has some numbness on her left cheek and fingertips. Her vision difficulties seem to have resolved. 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 Transcranial Doppler study on 01/14/2014 was suboptimal due to  poorBITEMPORAL WINDOWS. She is participating in the RESPECT ESUS trial  and is tolerating this study medication without any side effects. Update 10/03/2016 : She returns for follow-up after completing participation in the respect cases trial in May 2018. She continues to do well. She still has some residual numbness of the left lip and little finger of the left hand. She denies any new recurrent stroke or TIA symptoms. She is tolerating aspirin 325 mg well without any upset stomach, bruising or bleeding. Blood pressure remains well controlled and today it is 132/79. She is on Zetia for hyperlipidemia has her insurance company refused to pay for Lipitor. She does have an upcoming lipid profile to be checked next week. She says her main complaints are arthritic pain in the knee. She does do tai chi 3 days a week and participates in Silver sneakers as well. She has not lost any weight but plans to do so. She has not had any follow-up carotid ultrasound study done in more than 2 years.  ROS:   14 system review of systems is positive for numbness, tingling,  Knee pain, anxiety, nervousness and all other systems negative  PMH:  Past Medical History:  Diagnosis Date  . Acid reflux   . Arthritis   . Cystocele   . Kidney stones   . Osteopenia   09/2014   T score -2.4 distal third left radius. Remainder of DEXA normal  . Stroke ([place])   . Uterine prolapse     Social History:  Social History   Social History  . Marital status: Widowed    Spouse name: N/A  . Number of children: 2  . Years of education: MASTERS   Occupational History  . Not on file.   Social History Main Topics  . Smoking status: Never Smoker  . Smokeless tobacco: Never Used  . Alcohol use  0.6 oz/week    1 Glasses of wine per week     Comment: Rare  . Drug use: No  . Sexual activity: No     Comment: INTERCOURSE AGE 23, SEXUAL PARTNERS LESS THAN 5   Other Topics Concern  . Not on file   Social History Narrative   Patient is married with 2 children.   Patient is right handed.   Patient has her Masters degree.   Patient drinks 2 cups daily.    Medications:   Current Outpatient Prescriptions on File Prior to Visit  Medication Sig Dispense Refill  . aspirin EC 325 MG tablet Take 1 tablet (325 mg total) by mouth daily. 30 tablet 0  . Calcium Carbonate-Vitamin D (CALCIUM + D PO) Take by mouth.    . celecoxib (CELEBREX) 100 MG capsule Take 1 capsule by mouth daily.    Marland Kitchen esomeprazole (NEXIUM) 20 MG capsule Take 20 mg by mouth daily at 12 noon.    . Multiple Vitamin (MULTIVITAMIN) tablet Take 1 tablet by mouth daily.    . NONFORMULARY OR COMPOUNDED ITEM EYE DROPS    . Omega-3 Fatty Acids (FISH OIL PO) Take by mouth.    . triamcinolone (NASACORT AQ) 55 MCG/ACT AERO nasal inhaler Place 2 sprays into the nose daily.     No current facility-administered medications on file prior to visit.     Allergies:   Allergies  Allergen Reactions  . Bee Venom   . Claritin [Loratadine] Nausea And Vomiting  . Ibuprofen   . Sulfonamide Derivatives     Physical Exam General: obese middle aged caucasian seated, in no evident distress Head: head normocephalic and atraumatic.   Neck: supple with no carotid or supraclavicular bruits Cardiovascular: regular rate and rhythm, no murmurs Musculoskeletal: no deformity Skin:  no rash/petichiae Vascular:  Normal pulses all extremities Vitals:   10/03/16 0955  BP: 132/79  Pulse: 82    Neurologic Exam Mental Status: Awake and fully alert. Oriented to place and time. Recent and remote memory intact. Attention span, concentration and fund of knowledge appropriate. Mood and affect appropriate.  Cranial Nerves: Fundoscopic exam not done  .  Pupils equal, briskly reactive to light. Extraocular movements full without nystagmus. Visual fields full to confrontation. Hearing intact. Facial sensation intact. Face, tongue, palate moves normally and symmetrically.  Motor: Normal bulk and tone. Normal strength in all tested extremity muscles. Sensory.: intact to touch , pinprick , position and vibratory sensation. Except left cheek and fingertips  Coordination: Rapid alternating movements normal in all extremities. Finger-to-nose and heel-to-shin performed accurately bilaterally. Gait and Station: Arises from chair without difficulty. Stance is normal. Gait demonstrates normal stride length and balance . Able to heel, toe and tandem walk with mild  difficulty.  Reflexes: 1+ and symmetric. Toes downgoing.       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  .She finished particpation in RESPECT ESUS trial. Vascular risk factors of hyperlipidemia and morbid obesityonly    PLAN: I had a long d/w patient about her remote stroke, risk for recurrent stroke/TIAs, personally independently reviewed imaging studies and stroke evaluation results and answered questions.Continue aspirin 325 mg daily  for secondary stroke prevention and maintain strict control of hypertension with blood pressure goal below 130/90, diabetes with hemoglobin A1c goal below 6.5% and lipids with LDL cholesterol goal below 70 mg/dL. I also advised the patient to eat a healthy diet with plenty of whole grains, cereals, fruits and vegetables, exercise regularly and maintain ideal body weight. Check follow-up screening carotid ultrasound study as she is not hand one in more than 2 years. Greater than 50% time during this 25 mm it was spent on counseling and coordination of care about her remote stroke, discussion about  obesity and weight loss and stroke prevention Followup in the future with me in  1 year or call earlier if necessary  , MD  [facility] [address] [place], [place] [address]  Phone [phone] Fax [phone]   Note: This document was prepared with digital dictation and possible smart phrase technology. Any transcriptional errors that result from this process are unintentional.    

## 2016-10-03 NOTE — Patient Instructions (Signed)
I had a long d/w patient about her remote stroke, risk for recurrent stroke/TIAs, personally independently reviewed imaging studies and stroke evaluation results and answered questions.Continue aspirin 325 mg daily  for secondary stroke prevention and maintain strict control of hypertension with blood pressure goal below 130/90, diabetes with hemoglobin A1c goal below 6.5% and lipids with LDL cholesterol goal below 70 mg/dL. I also advised the patient to eat a healthy diet with plenty of whole grains, cereals, fruits and vegetables, exercise regularly and maintain ideal body weight. Check follow-up screening carotid ultrasound study as she is not hand one in more than 2 years. Followup in the future with me in  1 year or call earlier if necessary

## 2017-05-14 ENCOUNTER — Encounter (HOSPITAL_COMMUNITY): Payer: Self-pay | Admitting: Emergency Medicine

## 2017-05-14 ENCOUNTER — Emergency Department (HOSPITAL_COMMUNITY): Payer: Medicare Other

## 2017-05-14 ENCOUNTER — Other Ambulatory Visit: Payer: Self-pay

## 2017-05-14 ENCOUNTER — Emergency Department (HOSPITAL_COMMUNITY)
Admission: EM | Admit: 2017-05-14 | Discharge: 2017-05-14 | Disposition: A | Discharge status: Home / Self Care | Payer: Medicare Other | Attending: Emergency Medicine | Admitting: Emergency Medicine

## 2017-05-14 DIAGNOSIS — Y9389 Activity, other specified: Secondary | ICD-10-CM | POA: Diagnosis not present

## 2017-05-14 DIAGNOSIS — Y92003 Bedroom of unspecified non-institutional (private) residence as the place of occurrence of the external cause: Secondary | ICD-10-CM | POA: Diagnosis not present

## 2017-05-14 DIAGNOSIS — Y999 Unspecified external cause status: Secondary | ICD-10-CM | POA: Insufficient documentation

## 2017-05-14 DIAGNOSIS — Z7982 Long term (current) use of aspirin: Secondary | ICD-10-CM | POA: Insufficient documentation

## 2017-05-14 DIAGNOSIS — W01190A Fall on same level from slipping, tripping and stumbling with subsequent striking against furniture, initial encounter: Secondary | ICD-10-CM | POA: Diagnosis not present

## 2017-05-14 DIAGNOSIS — R51 Headache: Secondary | ICD-10-CM | POA: Diagnosis not present

## 2017-05-14 DIAGNOSIS — S0181XA Laceration without foreign body of other part of head, initial encounter: Secondary | ICD-10-CM | POA: Diagnosis not present

## 2017-05-14 DIAGNOSIS — Z79899 Other long term (current) drug therapy: Secondary | ICD-10-CM | POA: Insufficient documentation

## 2017-05-14 DIAGNOSIS — S0993XA Unspecified injury of face, initial encounter: Secondary | ICD-10-CM | POA: Diagnosis present

## 2017-05-14 MED ORDER — LIDOCAINE-EPINEPHRINE (PF) 2 %-1:200000 IJ SOLN
20.0000 mL | Freq: Once | INTRAMUSCULAR | Status: DC
  Filled 2017-05-14: qty 20

## 2017-05-14 MED ORDER — BACITRACIN-NEOMYCIN-POLYMYXIN 400-5-5000 EX OINT
TOPICAL_OINTMENT | Freq: Once | CUTANEOUS | Status: AC
  Administered 2017-05-14: 1 via TOPICAL
  Filled 2017-05-14: qty 1

## 2017-05-14 MED ORDER — ACETAMINOPHEN 500 MG PO TABS
1000.0000 mg | ORAL_TABLET | Freq: Once | ORAL | Status: AC
  Administered 2017-05-14: 1000 mg via ORAL
  Filled 2017-05-14: qty 2

## 2017-05-14 NOTE — ED Triage Notes (Signed)
Pt states she was getting up to "cut her heat down" when she tripped over her carpet and hit her head on the dresser. Pt has 4cm lacertion above right eye.

## 2017-05-14 NOTE — Discharge Instructions (Signed)
Return to the ER or see your private physician in 6-7 days for suture removal. Watch for signs of infection, such as redness, swelling, increased pain or drainage of pus.  Return to the ER or see your private physician if these occur.

## 2017-05-14 NOTE — ED Provider Notes (Signed)
[facility] EMERGENCY DEPARTMENT Provider Note   CSN: [id number] Arrival date & time: 05/14/17  0445     History   Chief Complaint Chief Complaint  Patient presents with  . Fall    HPI Joanna Good is a 67 y.o. female.  Patient presents to the ER for evaluation of laceration after a fall.  Patient reports that she got out of bed to adjust her heat and tripped over carpet.  She had her head on a dresser.  She was wearing her glasses which likely caused the laceration above the right eye.  She has a moderate headache and pain around the laceration.  She reports that her tetanus is up-to-date.  No neck or back pain.  No extremity injury.      Past Medical History:  Diagnosis Date  . Acid reflux   . Arthritis   . Cystocele   . Kidney stones   . Osteopenia 09/2014   T score -2.4 distal third left radius. Remainder of DEXA normal  . Stroke ([facility])   . Uterine prolapse     Patient Active Problem List   Diagnosis Date Noted  . 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 Kidney stones   . Uterine prolapse   . Cystocele   . HYPERLIPIDEMIA TYPE IIB / III 12/22/2008    Past Surgical History:  Procedure Laterality Date  . TONSILLECTOMY      OB History    Gravida Para Term Preterm AB Living   2 2 2     2    SAB TAB Ectopic Multiple Live Births                   Home Medications    Prior to Admission medications   Medication Sig Start Date End Date Taking? Authorizing Provider  aspirin EC 325 MG tablet Take 1 tablet (325 mg total) by mouth daily. 07/17/16   Marvel PlanXu, Jindong, MD  Calcium Carbonate-Vitamin D (CALCIUM + D PO) Take by mouth.    [provider]  celecoxib (CELEBREX) 100 MG capsule Take 1 capsule by mouth daily. 07/09/14   [provider]  esomeprazole (NEXIUM) 20 MG capsule Take 20 mg by mouth daily at 12 noon.    [provider]  ezetimibe (ZETIA) 10 MG tablet Take 10 mg by mouth daily.    [provider]  Multiple Vitamin  (MULTIVITAMIN) tablet Take 1 tablet by mouth daily.    [provider]  NONFORMULARY OR COMPOUNDED ITEM EYE DROPS    [provider]  Omega-3 Fatty Acids (FISH OIL PO) Take by mouth.    [provider]  OVER THE COUNTER MEDICATION     [provider]  triamcinolone (NASACORT AQ) 55 MCG/ACT AERO nasal inhaler Place 2 sprays into the nose daily.    [provider]    Family History Family History  Problem Relation Age of Onset  . Heart disease Mother   . Hypertension Mother   . Diabetes Father   . Heart disease Father   . Hypertension Father   . Diabetes Sister   . Hypertension Sister   . Breast cancer Maternal Aunt        Age 67  . Breast cancer Paternal Aunt        Age 67    Social History Social History   Tobacco Use  . Smoking status: Never Smoker  . Smokeless tobacco: Never Used  Substance Use Topics  . Alcohol use: Yes  Alcohol/week: 0.6 oz    Types: 1 Glasses of wine per week    Comment: Rare  . Drug use: No     Allergies   Bee venom; Claritin [loratadine]; Ibuprofen; and Sulfonamide derivatives   Review of Systems Review of Systems  Skin: Positive for wound.  Neurological: Positive for headaches.  All other systems reviewed and are negative.    Physical Exam Updated Vital Signs BP (!) 161/69 (BP Location: Right Arm)   Pulse 88   Temp 98.3 F (36.8 C) (Oral)   Resp 18   Ht 5\' 3"  (1.6 m)   Wt 90.7 kg (200 lb)   SpO2 100%   BMI 35.43 kg/m   Physical Exam  Constitutional: She is oriented to person, place, and time. She appears well-developed and well-nourished. No distress.  HENT:  Head: Normocephalic. Head is with laceration (4cm, jagged around right eyebrow).  Right Ear: Hearing normal.  Left Ear: Hearing normal.  Nose: Nose normal.  Mouth/Throat: Oropharynx is clear and moist and mucous membranes are normal.  Eyes: Conjunctivae and EOM are normal. Pupils are equal, round, and reactive to light.    Neck: Normal range of motion. Neck supple.  Cardiovascular: Regular rhythm, S1 normal and S2 normal. Exam reveals no gallop and no friction rub.  No murmur heard. Pulmonary/Chest: Effort normal and breath sounds normal. No respiratory distress. She exhibits no tenderness.  Abdominal: Soft. Normal appearance and bowel sounds are normal. There is no hepatosplenomegaly. There is no tenderness. There is no rebound, no guarding, no tenderness at McBurney's point and negative Murphy's sign. No hernia.  Musculoskeletal: Normal range of motion.  Neurological: She is alert and oriented to person, place, and time. She has normal strength. No cranial nerve deficit or sensory deficit. Coordination normal. GCS eye subscore is 4. GCS verbal subscore is 5. GCS motor subscore is 6.  Skin: Skin is warm and dry. Laceration (right eyebrow) noted. No rash noted. No cyanosis.  Psychiatric: She has a normal mood and affect. Her speech is normal and behavior is normal. Thought content normal.  Nursing note and vitals reviewed.    ED Treatments / Results  Labs (all labs ordered are listed, but only abnormal results are displayed) Labs Reviewed - No data to display  EKG  EKG Interpretation None       Radiology Ct Head Wo Contrast  Result Date: 05/14/2017 CLINICAL DATA:  Status post fall. Hit head on dresser. Laceration above the right orbit. EXAM: CT HEAD WITHOUT CONTRAST TECHNIQUE: Contiguous axial images were obtained from the base of the skull through the vertex without intravenous contrast. COMPARISON:  CT of the head performed 01/06/2014, and MRI of the brain performed 01/12/2014 FINDINGS: Brain: No evidence of acute infarction, hemorrhage, hydrocephalus, extra-axial collection or mass lesion/mass effect. Mild periventricular white matter change likely reflects small vessel ischemic microangiopathy. The posterior fossa, including the cerebellum, brainstem and fourth ventricle, is within normal limits. The  third and lateral ventricles, and basal ganglia are unremarkable in appearance. The cerebral hemispheres are symmetric in appearance, with normal gray-white differentiation. No mass effect or midline shift is seen. Vascular: No hyperdense vessel or unexpected calcification. Skull: There is no evidence of fracture; visualized osseous structures are unremarkable in appearance. Sinuses/Orbits: The orbits are within normal limits. The paranasal sinuses and mastoid air cells are well-aerated. Other: Mild soft tissue swelling is noted overlying the right orbit. IMPRESSION: 1. No evidence of traumatic intracranial injury or fracture. 2. Mild small vessel ischemic microangiopathy. 3.  Mild soft tissue swelling overlying the right orbit. Electronically Signed   By: Roanna Raider M.D.   On: 05/14/2017 05:54    Procedures .Marland KitchenLaceration Repair Date/Time: 05/14/2017 7:01 AM Performed by: Gilda Crease, MD Authorized by: Gilda Crease, MD   Consent:    Consent obtained:  Verbal   Consent given by:  Patient   Risks discussed:  Infection, pain and poor cosmetic result Universal protocol:    Procedure explained and questions answered to patient or proxy's satisfaction: yes     Relevant documents present and verified: yes     Test results available and properly labeled: yes     Imaging studies available: yes     Required blood products, implants, devices, and special equipment available: yes     Site/side marked: yes     Immediately prior to procedure, a time out was called: yes     Patient identity confirmed:  Verbally with patient Anesthesia (see MAR for exact dosages):    Anesthesia method:  Local infiltration   Local anesthetic:  Lidocaine 2% WITH epi Laceration details:    Location:  Face   Face location:  R eyebrow   Length (cm):  4 Repair type:    Repair type:  Simple Pre-procedure details:    Preparation:  Patient was prepped and draped in usual sterile fashion and imaging  obtained to evaluate for foreign bodies Exploration:    Hemostasis achieved with:  Epinephrine and direct pressure   Contaminated: no   Treatment:    Area cleansed with:  Betadine   Amount of cleaning:  Standard   Irrigation solution:  Sterile saline   Irrigation method:  Syringe Skin repair:    Repair method:  Sutures   Suture size:  5-0   Suture material:  Nylon   Number of sutures:  9 Approximation:    Approximation:  Close   Vermilion border: well-aligned   Post-procedure details:    Dressing:  Antibiotic ointment   Patient tolerance of procedure:  Tolerated well, no immediate complications   (including critical care time)  Medications Ordered in ED Medications  lidocaine-EPINEPHrine (XYLOCAINE W/EPI) 2 %-1:200000 (PF) injection 20 mL (not administered)  acetaminophen (TYLENOL) tablet 1,000 mg (1,000 mg Oral Given 05/14/17 0549)     Initial Impression / Assessment and Plan / ED Course  I have reviewed the triage vital signs and the nursing notes.  Pertinent labs & imaging results that were available during my care of the patient were reviewed by me and considered in my medical decision making (see chart for details).     Presented for evaluation of laceration suffered after a fall.  Patient struck her head on a dresser, suffering a laceration when her glasses hit the eyebrow.  She did have a jagged and somewhat stellate laceration that required repair.  CT head did not show any acute injury.  No other injury noted.  Patient's tetanus is up-to-date.  Will have sutures removed in 6 or 7 days.  Was given wound care precautions.  Final Clinical Impressions(s) / ED Diagnoses   Final diagnoses:  Facial laceration, initial encounter    ED Discharge Orders    None       Gilda Crease, MD 05/14/17 [phone]

## 2018-02-23 ENCOUNTER — Emergency Department (HOSPITAL_COMMUNITY): Payer: Medicare Other

## 2018-02-23 ENCOUNTER — Encounter (HOSPITAL_COMMUNITY): Payer: Self-pay | Admitting: Emergency Medicine

## 2018-02-23 ENCOUNTER — Other Ambulatory Visit: Payer: Self-pay

## 2018-02-23 ENCOUNTER — Inpatient Hospital Stay (HOSPITAL_COMMUNITY)
Admission: EM | Admit: 2018-02-23 | Discharge: 2018-03-01 | DRG: 853 | Disposition: A | Discharge status: Home Health | Payer: Medicare Other | Attending: Family Medicine | Admitting: Family Medicine

## 2018-02-23 DIAGNOSIS — Z803 Family history of malignant neoplasm of breast: Secondary | ICD-10-CM | POA: Diagnosis not present

## 2018-02-23 DIAGNOSIS — R6521 Severe sepsis with septic shock: Secondary | ICD-10-CM | POA: Diagnosis present

## 2018-02-23 DIAGNOSIS — Z7982 Long term (current) use of aspirin: Secondary | ICD-10-CM

## 2018-02-23 DIAGNOSIS — Z91030 Bee allergy status: Secondary | ICD-10-CM

## 2018-02-23 DIAGNOSIS — E669 Obesity, unspecified: Secondary | ICD-10-CM | POA: Diagnosis present

## 2018-02-23 DIAGNOSIS — E876 Hypokalemia: Secondary | ICD-10-CM | POA: Diagnosis present

## 2018-02-23 DIAGNOSIS — I10 Essential (primary) hypertension: Secondary | ICD-10-CM | POA: Diagnosis not present

## 2018-02-23 DIAGNOSIS — D721 Eosinophilia: Secondary | ICD-10-CM | POA: Diagnosis present

## 2018-02-23 DIAGNOSIS — R194 Change in bowel habit: Secondary | ICD-10-CM

## 2018-02-23 DIAGNOSIS — A419 Sepsis, unspecified organism: Secondary | ICD-10-CM | POA: Diagnosis not present

## 2018-02-23 DIAGNOSIS — R579 Shock, unspecified: Secondary | ICD-10-CM | POA: Diagnosis not present

## 2018-02-23 DIAGNOSIS — I9581 Postprocedural hypotension: Secondary | ICD-10-CM | POA: Diagnosis not present

## 2018-02-23 DIAGNOSIS — K219 Gastro-esophageal reflux disease without esophagitis: Secondary | ICD-10-CM | POA: Diagnosis not present

## 2018-02-23 DIAGNOSIS — D696 Thrombocytopenia, unspecified: Secondary | ICD-10-CM | POA: Diagnosis present

## 2018-02-23 DIAGNOSIS — I4891 Unspecified atrial fibrillation: Secondary | ICD-10-CM | POA: Diagnosis not present

## 2018-02-23 DIAGNOSIS — M8580 Other specified disorders of bone density and structure, unspecified site: Secondary | ICD-10-CM | POA: Diagnosis not present

## 2018-02-23 DIAGNOSIS — I959 Hypotension, unspecified: Secondary | ICD-10-CM | POA: Diagnosis not present

## 2018-02-23 DIAGNOSIS — Z886 Allergy status to analgesic agent status: Secondary | ICD-10-CM

## 2018-02-23 DIAGNOSIS — R059 Cough, unspecified: Secondary | ICD-10-CM

## 2018-02-23 DIAGNOSIS — N200 Calculus of kidney: Secondary | ICD-10-CM | POA: Diagnosis not present

## 2018-02-23 DIAGNOSIS — E860 Dehydration: Secondary | ICD-10-CM | POA: Diagnosis present

## 2018-02-23 DIAGNOSIS — Z6835 Body mass index (BMI) 35.0-35.9, adult: Secondary | ICD-10-CM

## 2018-02-23 DIAGNOSIS — Z888 Allergy status to other drugs, medicaments and biological substances status: Secondary | ICD-10-CM

## 2018-02-23 DIAGNOSIS — M1990 Unspecified osteoarthritis, unspecified site: Secondary | ICD-10-CM | POA: Diagnosis present

## 2018-02-23 DIAGNOSIS — Z8673 Personal history of transient ischemic attack (TIA), and cerebral infarction without residual deficits: Secondary | ICD-10-CM

## 2018-02-23 DIAGNOSIS — N179 Acute kidney failure, unspecified: Secondary | ICD-10-CM

## 2018-02-23 DIAGNOSIS — N814 Uterovaginal prolapse, unspecified: Secondary | ICD-10-CM | POA: Diagnosis present

## 2018-02-23 DIAGNOSIS — Z882 Allergy status to sulfonamides status: Secondary | ICD-10-CM

## 2018-02-23 DIAGNOSIS — R05 Cough: Secondary | ICD-10-CM

## 2018-02-23 DIAGNOSIS — E870 Hyperosmolality and hypernatremia: Secondary | ICD-10-CM | POA: Diagnosis present

## 2018-02-23 DIAGNOSIS — E872 Acidosis: Secondary | ICD-10-CM | POA: Diagnosis present

## 2018-02-23 DIAGNOSIS — R21 Rash and other nonspecific skin eruption: Secondary | ICD-10-CM | POA: Diagnosis present

## 2018-02-23 DIAGNOSIS — I358 Other nonrheumatic aortic valve disorders: Secondary | ICD-10-CM | POA: Diagnosis not present

## 2018-02-23 DIAGNOSIS — Z8249 Family history of ischemic heart disease and other diseases of the circulatory system: Secondary | ICD-10-CM

## 2018-02-23 DIAGNOSIS — K8021 Calculus of gallbladder without cholecystitis with obstruction: Secondary | ICD-10-CM | POA: Diagnosis present

## 2018-02-23 DIAGNOSIS — A4159 Other Gram-negative sepsis: Secondary | ICD-10-CM | POA: Diagnosis present

## 2018-02-23 DIAGNOSIS — Z833 Family history of diabetes mellitus: Secondary | ICD-10-CM

## 2018-02-23 DIAGNOSIS — B961 Klebsiella pneumoniae [K. pneumoniae] as the cause of diseases classified elsewhere: Secondary | ICD-10-CM | POA: Diagnosis present

## 2018-02-23 DIAGNOSIS — K563 Gallstone ileus: Secondary | ICD-10-CM

## 2018-02-23 DIAGNOSIS — K838 Other specified diseases of biliary tract: Secondary | ICD-10-CM | POA: Diagnosis not present

## 2018-02-23 HISTORY — DX: Essential (primary) hypertension: I10

## 2018-02-23 LAB — COMPREHENSIVE METABOLIC PANEL
ALBUMIN: 3.3 g/dL — AB (ref 3.5–5.0)
ALT: 30 U/L (ref 0–44)
ALT: 28 U/L (ref 0–44)
AST: 30 U/L (ref 15–41)
AST: 30 U/L (ref 15–41)
Albumin: 2.5 g/dL — ABNORMAL LOW (ref 3.5–5.0)
Alkaline Phosphatase: 49 U/L (ref 38–126)
Alkaline Phosphatase: 41 U/L (ref 38–126)
Anion gap: 15 (ref 5–15)
Anion gap: 12 (ref 5–15)
BUN: 52 mg/dL — ABNORMAL HIGH (ref 8–23)
BUN: 50 mg/dL — ABNORMAL HIGH (ref 8–23)
CO2: 23 mmol/L (ref 22–32)
CO2: 22 mmol/L (ref 22–32)
CREATININE: 3.73 mg/dL — AB (ref 0.44–1.00)
CREATININE: 3.42 mg/dL — AB (ref 0.44–1.00)
Calcium: 8.7 mg/dL — ABNORMAL LOW (ref 8.9–10.3)
Calcium: 7.9 mg/dL — ABNORMAL LOW (ref 8.9–10.3)
Chloride: 102 mmol/L (ref 98–111)
Chloride: 102 mmol/L (ref 98–111)
GFR calc Af Amer: 14 mL/min — ABNORMAL LOW (ref >60)
GFR calc Af Amer: 15 mL/min — ABNORMAL LOW (ref >60)
GFR calc non Af Amer: 12 mL/min — ABNORMAL LOW (ref >60)
GFR calc non Af Amer: 13 mL/min — ABNORMAL LOW (ref >60)
GLUCOSE: 137 mg/dL — AB (ref 70–99)
Glucose, Bld: 169 mg/dL — ABNORMAL HIGH (ref 70–99)
Potassium: 3.4 mmol/L — ABNORMAL LOW (ref 3.5–5.1)
Potassium: 3 mmol/L — ABNORMAL LOW (ref 3.5–5.1)
Sodium: 140 mmol/L (ref 135–145)
Sodium: 136 mmol/L (ref 135–145)
Total Bilirubin: 0.5 mg/dL (ref 0.3–1.2)
Total Bilirubin: 1 mg/dL (ref 0.3–1.2)
Total Protein: 6.5 g/dL (ref 6.5–8.1)
Total Protein: 5.4 g/dL — ABNORMAL LOW (ref 6.5–8.1)

## 2018-02-23 LAB — CBC WITH DIFFERENTIAL/PLATELET
Abs Immature Granulocytes: 0.17 10*3/uL — ABNORMAL HIGH (ref 0.00–0.07)
Abs Immature Granulocytes: 0.09 10*3/uL — ABNORMAL HIGH (ref 0.00–0.07)
BASOS PCT: 0 %
Basophils Absolute: 0.1 10*3/uL (ref 0.0–0.1)
Basophils Absolute: 0.1 10*3/uL (ref 0.0–0.1)
Basophils Relative: 1 %
Eosinophils Absolute: 0.4 10*3/uL (ref 0.0–0.5)
Eosinophils Absolute: 0.1 10*3/uL (ref 0.0–0.5)
Eosinophils Relative: 2 %
Eosinophils Relative: 1 %
HCT: 43.9 % (ref 36.0–46.0)
HCT: 38 % (ref 36.0–46.0)
Hemoglobin: 14 g/dL (ref 12.0–15.0)
Hemoglobin: 12.4 g/dL (ref 12.0–15.0)
IMMATURE GRANULOCYTES: 1 %
Immature Granulocytes: 1 %
LYMPHS ABS: 1.3 10*3/uL (ref 0.7–4.0)
LYMPHS PCT: 8 %
Lymphocytes Relative: 10 %
Lymphs Abs: 1.3 10*3/uL (ref 0.7–4.0)
MCH: 30.2 pg (ref 26.0–34.0)
MCH: 30.2 pg (ref 26.0–34.0)
MCHC: 31.9 g/dL (ref 30.0–36.0)
MCHC: 32.6 g/dL (ref 30.0–36.0)
MCV: 94.8 fL (ref 80.0–100.0)
MCV: 92.5 fL (ref 80.0–100.0)
MONO ABS: 0.5 10*3/uL (ref 0.1–1.0)
Monocytes Absolute: 0.8 10*3/uL (ref 0.1–1.0)
Monocytes Relative: 5 %
Monocytes Relative: 4 %
Neutro Abs: 13.1 10*3/uL — ABNORMAL HIGH (ref 1.7–7.7)
Neutro Abs: 10.6 10*3/uL — ABNORMAL HIGH (ref 1.7–7.7)
Neutrophils Relative %: 83 %
Neutrophils Relative %: 84 %
Platelets: 181 10*3/uL (ref 150–400)
Platelets: 173 10*3/uL (ref 150–400)
RBC: 4.63 MIL/uL (ref 3.87–5.11)
RBC: 4.11 MIL/uL (ref 3.87–5.11)
RDW: 15.8 % — ABNORMAL HIGH (ref 11.5–15.5)
RDW: 16.2 % — ABNORMAL HIGH (ref 11.5–15.5)
WBC: 15.8 10*3/uL — ABNORMAL HIGH (ref 4.0–10.5)
WBC: 12.6 10*3/uL — ABNORMAL HIGH (ref 4.0–10.5)
nRBC: 0 % (ref 0.0–0.2)
nRBC: 0 % (ref 0.0–0.2)

## 2018-02-23 LAB — I-STAT CHEM 8, ED
BUN: 45 mg/dL — ABNORMAL HIGH (ref 8–23)
Calcium, Ion: 1.11 mmol/L — ABNORMAL LOW (ref 1.15–1.40)
Chloride: 100 mmol/L (ref 98–111)
Creatinine, Ser: 3.8 mg/dL — ABNORMAL HIGH (ref 0.44–1.00)
Glucose, Bld: 130 mg/dL — ABNORMAL HIGH (ref 70–99)
HEMATOCRIT: 43 % (ref 36.0–46.0)
Hemoglobin: 14.6 g/dL (ref 12.0–15.0)
Potassium: 3.5 mmol/L (ref 3.5–5.1)
Sodium: 139 mmol/L (ref 135–145)
TCO2: 25 mmol/L (ref 22–32)

## 2018-02-23 LAB — HEMOGLOBIN A1C
Hgb A1c MFr Bld: 5.6 % (ref 4.8–5.6)
Mean Plasma Glucose: 114.02 mg/dL

## 2018-02-23 LAB — BLOOD GAS, VENOUS
ACID-BASE EXCESS: 0.5 mmol/L (ref 0.0–2.0)
Bicarbonate: 24.4 mmol/L (ref 20.0–28.0)
FIO2: 21
O2 Saturation: 83.1 %
pCO2, Ven: 41.4 mmHg — ABNORMAL LOW (ref 44.0–60.0)
pH, Ven: 7.395 (ref 7.250–7.430)
pO2, Ven: 50.7 mmHg — ABNORMAL HIGH (ref 32.0–45.0)

## 2018-02-23 LAB — URINALYSIS, ROUTINE W REFLEX MICROSCOPIC
Bilirubin Urine: NEGATIVE
Glucose, UA: NEGATIVE mg/dL
KETONES UR: 15 mg/dL — AB
NITRITE: NEGATIVE
PROTEIN: 100 mg/dL — AB
Specific Gravity, Urine: 1.025 (ref 1.005–1.030)
pH: 5.5 (ref 5.0–8.0)

## 2018-02-23 LAB — LIPASE, BLOOD: Lipase: 20 U/L (ref 11–51)

## 2018-02-23 LAB — I-STAT CG4 LACTIC ACID, ED
Lactic Acid, Venous: 5.69 mmol/L (ref 0.5–1.9)
Lactic Acid, Venous: 2.08 mmol/L (ref 0.5–1.9)

## 2018-02-23 LAB — URINALYSIS, MICROSCOPIC (REFLEX)

## 2018-02-23 LAB — TYPE AND SCREEN
ABO/RH(D): O POS
Antibody Screen: NEGATIVE

## 2018-02-23 LAB — GLUCOSE, CAPILLARY: Glucose-Capillary: 111 mg/dL — ABNORMAL HIGH (ref 70–99)

## 2018-02-23 LAB — APTT: aPTT: 30 seconds (ref 24–36)

## 2018-02-23 LAB — MAGNESIUM: Magnesium: 1.7 mg/dL (ref 1.7–2.4)

## 2018-02-23 LAB — ABO/RH: ABO/RH(D): O POS

## 2018-02-23 LAB — PROTIME-INR
INR: 1.23
Prothrombin Time: 15.4 seconds — ABNORMAL HIGH (ref 11.4–15.2)

## 2018-02-23 LAB — PREPARE RBC (CROSSMATCH)

## 2018-02-23 LAB — MRSA PCR SCREENING: MRSA by PCR: NEGATIVE

## 2018-02-23 LAB — LACTIC ACID, PLASMA: Lactic Acid, Venous: 4.2 mmol/L (ref 0.5–1.9)

## 2018-02-23 LAB — TROPONIN I: TROPONIN I: 0.06 ng/mL — AB (ref <0.03)

## 2018-02-23 LAB — PHOSPHORUS: Phosphorus: 3.9 mg/dL (ref 2.5–4.6)

## 2018-02-23 LAB — POC OCCULT BLOOD, ED: Fecal Occult Bld: POSITIVE — AB

## 2018-02-23 MED ORDER — ONDANSETRON HCL 4 MG/2ML IJ SOLN
INTRAMUSCULAR | Status: AC
  Administered 2018-02-23: 4 mg via INTRAVENOUS
  Filled 2018-02-23: qty 2

## 2018-02-23 MED ORDER — LACTATED RINGERS IV SOLN
INTRAVENOUS | Status: DC
  Administered 2018-02-23 – 2018-02-25 (×6): via INTRAVENOUS

## 2018-02-23 MED ORDER — HYDROCORTISONE NA SUCCINATE PF 100 MG IJ SOLR
50.0000 mg | Freq: Four times a day (QID) | INTRAMUSCULAR | Status: DC
  Administered 2018-02-23 – 2018-02-25 (×5): 50 mg via INTRAVENOUS
  Filled 2018-02-23 (×5): qty 2

## 2018-02-23 MED ORDER — INFLUENZA VAC SPLIT HIGH-DOSE 0.5 ML IM SUSY: 0.5000 mL | PREFILLED_SYRINGE | INTRAMUSCULAR | Status: DC

## 2018-02-23 MED ORDER — SODIUM CHLORIDE 0.9 % IV SOLN
1.0000 g | INTRAVENOUS | Status: DC
  Administered 2018-02-24: 1 g via INTRAVENOUS
  Filled 2018-02-23 (×2): qty 1

## 2018-02-23 MED ORDER — SODIUM CHLORIDE 0.9 % IV SOLN
INTRAVENOUS | Status: DC | PRN
  Administered 2018-02-24: 09:00:00 via INTRA_ARTERIAL

## 2018-02-23 MED ORDER — SODIUM CHLORIDE 0.9 % IV SOLN
80.0000 mg | Freq: Once | INTRAVENOUS | Status: AC
  Administered 2018-02-23: 80 mg via INTRAVENOUS
  Filled 2018-02-23: qty 80

## 2018-02-23 MED ORDER — PHENYLEPHRINE HCL-NACL 10-0.9 MG/250ML-% IV SOLN: 0.0000 ug/min | INTRAVENOUS | Status: DC

## 2018-02-23 MED ORDER — NOREPINEPHRINE BITARTRATE 1 MG/ML IV SOLN
0.0000 ug/min | INTRAVENOUS | Status: DC
  Administered 2018-02-23: 2 ug/min via INTRAVENOUS
  Administered 2018-02-23: 35 ug/min via INTRAVENOUS
  Filled 2018-02-23 (×2): qty 4

## 2018-02-23 MED ORDER — SODIUM CHLORIDE 0.9 % IV SOLN
8.0000 mg/h | INTRAVENOUS | Status: DC
  Administered 2018-02-23: 8 mg/h via INTRAVENOUS
  Filled 2018-02-23 (×4): qty 80

## 2018-02-23 MED ORDER — VASOPRESSIN 20 UNIT/ML IV SOLN
0.0300 [IU]/min | INTRAVENOUS | Status: DC
  Administered 2018-02-23: 0.03 [IU]/min via INTRAVENOUS
  Filled 2018-02-23: qty 2

## 2018-02-23 MED ORDER — SODIUM CHLORIDE 0.9 % IV BOLUS (SEPSIS)
2500.0000 mL | Freq: Once | INTRAVENOUS | Status: AC
  Administered 2018-02-23: 2500 mL via INTRAVENOUS

## 2018-02-23 MED ORDER — POTASSIUM CHLORIDE 10 MEQ/50ML IV SOLN
10.0000 meq | INTRAVENOUS | Status: AC
  Administered 2018-02-23 – 2018-02-24 (×4): 10 meq via INTRAVENOUS
  Filled 2018-02-23 (×4): qty 50

## 2018-02-23 MED ORDER — NOREPINEPHRINE 4 MG/250ML-% IV SOLN: 8.0000 ug/min | INTRAVENOUS | Status: DC

## 2018-02-23 MED ORDER — MAGNESIUM SULFATE 2 GM/50ML IV SOLN
2.0000 g | Freq: Once | INTRAVENOUS | Status: AC
  Administered 2018-02-23: 2 g via INTRAVENOUS
  Filled 2018-02-23: qty 50

## 2018-02-23 MED ORDER — ONDANSETRON HCL 4 MG/2ML IJ SOLN
4.0000 mg | Freq: Once | INTRAMUSCULAR | Status: AC
  Administered 2018-02-23 (×2): 4 mg via INTRAVENOUS
  Filled 2018-02-23: qty 2

## 2018-02-23 MED ORDER — HEPARIN SODIUM (PORCINE) 5000 UNIT/ML IJ SOLN
5000.0000 [IU] | Freq: Three times a day (TID) | INTRAMUSCULAR | Status: DC
  Administered 2018-02-23 – 2018-02-28 (×16): 5000 [IU] via SUBCUTANEOUS
  Filled 2018-02-23 (×16): qty 1

## 2018-02-23 MED ORDER — NOREPINEPHRINE 16 MG/250ML-% IV SOLN
0.0000 ug/min | INTRAVENOUS | Status: DC
  Administered 2018-02-23: 40 ug/min via INTRAVENOUS
  Administered 2018-02-24: 2 ug/min via INTRAVENOUS
  Filled 2018-02-23 (×2): qty 250

## 2018-02-23 MED ORDER — SODIUM CHLORIDE 0.9 % IV SOLN
INTRAVENOUS | Status: DC
  Administered 2018-02-23: 20:00:00 via INTRAVENOUS

## 2018-02-23 MED ORDER — LACTATED RINGERS IV BOLUS
1000.0000 mL | Freq: Once | INTRAVENOUS | Status: AC
  Administered 2018-02-23: 1000 mL via INTRAVENOUS

## 2018-02-23 MED ORDER — BENZOCAINE 20 % MT AERO
INHALATION_SPRAY | OROMUCOSAL | Status: AC
  Filled 2018-02-23: qty 57

## 2018-02-23 MED ORDER — PNEUMOCOCCAL VAC POLYVALENT 25 MCG/0.5ML IJ INJ: 0.5000 mL | INJECTION | INTRAMUSCULAR | Status: DC

## 2018-02-23 MED ORDER — METRONIDAZOLE IN NACL 5-0.79 MG/ML-% IV SOLN
500.0000 mg | Freq: Three times a day (TID) | INTRAVENOUS | Status: DC
  Administered 2018-02-24 – 2018-02-25 (×5): 500 mg via INTRAVENOUS
  Filled 2018-02-23 (×5): qty 100

## 2018-02-23 MED ORDER — METRONIDAZOLE IN NACL 5-0.79 MG/ML-% IV SOLN
500.0000 mg | Freq: Once | INTRAVENOUS | Status: AC
  Administered 2018-02-23: 500 mg via INTRAVENOUS
  Filled 2018-02-23: qty 100

## 2018-02-23 MED ORDER — LIDOCAINE HCL URETHRAL/MUCOSAL 2 % EX GEL
CUTANEOUS | Status: AC
  Filled 2018-02-23: qty 10

## 2018-02-23 MED ORDER — SODIUM CHLORIDE 0.9 % IV SOLN
1.0000 g | Freq: Once | INTRAVENOUS | Status: AC
  Administered 2018-02-23: 1 g via INTRAVENOUS
  Filled 2018-02-23: qty 1

## 2018-02-23 NOTE — ED Notes (Signed)
EMS gave 500 mL of NS in route to ED.

## 2018-02-23 NOTE — ED Notes (Signed)
Spoke with Dr. Rhunette CroftNanavati regarding possible central line placement while pt's bp is improved for future access.

## 2018-02-23 NOTE — Consult Note (Signed)
Reason for Consult:Gallstone Ileus Referring Physician: Dr. Newell Coral  Joanna Good is an 67 y.o. female.  HPI: This is a 67 year old female who was transferred down from [facility] with a diagnosis of gallstone ileus.  She presents with a 2-day history of nausea, vomiting, abdominal pain.  She is also had multiple dark stools.  This was worrisome for a possible GI bleed so she presented to the emergency department.  She was found to be hypotensive with a lactic acid level of 5.69.  She was volume resuscitated.  She underwent a CT scan of her abdomen and pelvis showing findings consistent with a gallstone ileus.  Because of her persistent hypotension, she was transferred to the critical care service at [facility].  Currently, she is awake and reports minimal abdominal discomfort.  Her last lactic acid level was down to 2.08.  She currently has a nasogastric tube in place.  Her hemoglobin has been stable.  She may have had prior attacks of biliary colic in the past but they have been mild.  She has had a stroke approximately 4 years ago.  Past Medical History:  Diagnosis Date  . Acid reflux   . Arthritis   . Cystocele   . Hypertension   . Kidney stones   . Osteopenia 09/2014   T score -2.4 distal third left radius. Remainder of DEXA normal  . Stroke (HCC)   . Uterine prolapse     Past Surgical History:  Procedure Laterality Date  . TONSILLECTOMY      Family History  Problem Relation Age of Onset  . Heart disease Mother   . Hypertension Mother   . Diabetes Father   . Heart disease Father   . Hypertension Father   . Diabetes Sister   . Hypertension Sister   . Breast cancer Maternal Aunt        Age 70  . Breast cancer Paternal Aunt        Age 23    Social History:  reports that she has never smoked. She has never used smokeless tobacco. She reports current alcohol use of about 1.0 standard drinks of alcohol per week. She reports that she does not use  drugs.  Allergies:  Allergies  Allergen Reactions  . Bee Venom   . Claritin [Loratadine] Nausea And Vomiting  . Ibuprofen   . Sulfonamide Derivatives     Medications: I have reviewed the patient's current medications.  Results for orders placed or performed during the hospital encounter of 02/23/18 (from the past 48 hour(s))  I-Stat CG4 Lactic Acid, ED     Status: Abnormal   Collection Time: 02/23/18 11:58 AM  Result Value Ref Range   Lactic Acid, Venous 5.69 (HH) 0.5 - 1.9 mmol/L   Comment NOTIFIED PHYSICIAN   Type and screen [facility]     Status: None (Preliminary result)   Collection Time: 02/23/18 11:59 AM  Result Value Ref Range   ABO/RH(D) O POS    Antibody Screen NEG    Sample Expiration 02/26/2018    Unit Number [id number]    Blood Component Type RED CELLS,LR    Unit division 00    Status of Unit ISSUED    Unit tag comment EMERGENCY RELEASE    Transfusion Status OK TO TRANSFUSE    Crossmatch Result COMPATIBLE    Unit Number [id number]    Blood Component Type RED CELLS,LR    Unit division 00    Status of Unit ALLOCATED  Unit tag comment OK TO TRANSFUSE    Transfusion Status OK TO TRANSFUSE    Crossmatch Result      COMPATIBLE Performed at [facility], [address] [place], [place] [address]   ABO/Rh     Status: None   Collection Time: 02/23/18 11:59 AM  Result Value Ref Range   ABO/RH(D)      O POS Performed at [facility], [address] [place], [place] [address]   Prepare RBC (crossmatch)     Status: None   Collection Time: 02/23/18 11:59 AM  Result Value Ref Range   Order Confirmation      ORDER PROCESSED BY BLOOD BANK Performed at [facility], [address] [place], [place] [address]   Lipase, blood     Status: None   Collection Time: 02/23/18 12:00 PM  Result Value Ref Range   Lipase 20 11 - 51 U/L    Comment: Performed at [facility], [address] [place], [place] [address]  POC occult blood, ED     Status: Abnormal    Collection Time: 02/23/18 12:03 PM  Result Value Ref Range   Fecal Occult Bld POSITIVE (A) NEGATIVE  Protime-INR     Status: Abnormal   Collection Time: 02/23/18 12:51 PM  Result Value Ref Range   Prothrombin Time 15.4 (H) 11.4 - 15.2 seconds   INR 1.23     Comment: Performed at [facility], [address] [place], [place] [address]  APTT     Status: None   Collection Time: 02/23/18 12:51 PM  Result Value Ref Range   aPTT 30 24 - 36 seconds    Comment: Performed at [facility], [address] [place], [place] [address]  CBC with Differential/Platelet     Status: Abnormal   Collection Time: 02/23/18 12:53 PM  Result Value Ref Range   WBC 15.8 (H) 4.0 - 10.5 K/uL   RBC 4.63 3.87 - 5.11 MIL/uL   Hemoglobin 14.0 12.0 - 15.0 g/dL   HCT 57.843.9 46.936.0 - 62.946.0 %   MCV 94.8 80.0 - 100.0 fL   MCH 30.2 26.0 - 34.0 pg   MCHC 31.9 30.0 - 36.0 g/dL   RDW 52.815.8 (H) 41.311.5 - 24.415.5 %   Platelets 181 150 - 400 K/uL   nRBC 0.0 0.0 - 0.2 %   Neutrophils Relative % 83 %   Neutro Abs 13.1 (H) 1.7 - 7.7 K/uL   Lymphocytes Relative 8 %   Lymphs Abs 1.3 0.7 - 4.0 K/uL   Monocytes Relative 5 %   Monocytes Absolute 0.8 0.1 - 1.0 K/uL   Eosinophils Relative 2 %   Eosinophils Absolute 0.4 0.0 - 0.5 K/uL   Basophils Relative 1 %   Basophils Absolute 0.1 0.0 - 0.1 K/uL   Immature Granulocytes 1 %   Abs Immature Granulocytes 0.17 (H) 0.00 - 0.07 K/uL   Reactive, Benign Lymphocytes PRESENT     Comment: Performed at [facility], [address] [place], [place] [address]  Comprehensive metabolic panel     Status: Abnormal   Collection Time: 02/23/18 12:53 PM  Result Value Ref Range   Sodium 140 135 - 145 mmol/L   Potassium 3.4 (L) 3.5 - 5.1 mmol/L   Chloride 102 98 - 111 mmol/L   CO2 23 22 - 32 mmol/L   Glucose, Bld 137 (H) 70 - 99 mg/dL   BUN 52 (H) 8 - 23 mg/dL   Creatinine, Ser 2.533.73 (H) 0.44 - 1.00 mg/dL  Calcium 8.7 (L) 8.9 - 10.3 mg/dL   Total Protein 6.5 6.5 - 8.1 g/dL   Albumin 3.3 (L) 3.5 -  5.0 g/dL   AST 30 15 - 41 U/L   ALT 30 0 - 44 U/L   Alkaline Phosphatase 49 38 - 126 U/L   Total Bilirubin 0.5 0.3 - 1.2 mg/dL   GFR calc non Af Amer 12 (L) >60 mL/min   GFR calc Af Amer 14 (L) >60 mL/min   Anion gap 15 5 - 15    Comment: Performed at [facility], [address], [place], [place] [address]  Lactic acid, plasma     Status: Abnormal   Collection Time: 02/23/18 12:53 PM  Result Value Ref Range   Lactic Acid, Venous 4.2 (HH) 0.5 - 1.9 mmol/L    Comment: CRITICAL RESULT CALLED TO, READ BACK BY AND VERIFIED WITH: TUTTLE,A@1416  BY MATTHEWS,B 12.22.19 Performed at [facility], [address] [place], [place] [address]   Blood Culture (routine x 2)     Status: None (Preliminary result)   Collection Time: 02/23/18 12:56 PM  Result Value Ref Range   Specimen Description BLOOD RIGHT ARM    Special Requests      BOTTLES DRAWN AEROBIC AND ANAEROBIC Blood Culture adequate volume Performed at [facility], [address], [place], [place] [address]    Culture PENDING    Report Status PENDING   I-Stat Chem 8, ED     Status: Abnormal   Collection Time: 02/23/18 12:57 PM  Result Value Ref Range   Sodium 139 135 - 145 mmol/L   Potassium 3.5 3.5 - 5.1 mmol/L   Chloride 100 98 - 111 mmol/L   BUN 45 (H) 8 - 23 mg/dL   Creatinine, Ser 6.21 (H) 0.44 - 1.00 mg/dL   Glucose, Bld 308 (H) 70 - 99 mg/dL   Calcium, Ion 6.57 (L) 1.15 - 1.40 mmol/L   TCO2 25 22 - 32 mmol/L   Hemoglobin 14.6 12.0 - 15.0 g/dL   HCT 84.6 96.2 - 95.2 %  Troponin I - ONCE - STAT     Status: Abnormal   Collection Time: 02/23/18 12:57 PM  Result Value Ref Range   Troponin I 0.06 (HH) <0.03 ng/mL    Comment: CRITICAL RESULT CALLED TO, READ BACK BY AND VERIFIED WITH: TUTTLE,A@1329  BY MATTHEWS,B 12.22.19 Performed at [facility], [address], [place], [place] [address]   Blood Culture (routine x 2)     Status: None (Preliminary result)   Collection Time: 02/23/18  2:36 PM  Result Value Ref Range    Specimen Description BLOOD CENTRAL LINE    Special Requests      BOTTLES DRAWN AEROBIC AND ANAEROBIC Blood Culture adequate volume Performed at [facility], [address], [place], [place] [address]    Culture PENDING    Report Status PENDING   Blood gas, venous     Status: Abnormal   Collection Time: 02/23/18  2:40 PM  Result Value Ref Range   FIO2 21.00    Delivery systems ROOM AIR    pH, Ven 7.395 7.250 - 7.430   pCO2, Ven 41.4 (L) 44.0 - 60.0 mmHg   pO2, Ven 50.7 (H) 32.0 - 45.0 mmHg   Bicarbonate 24.4 20.0 - 28.0 mmol/L   Acid-Base Excess 0.5 0.0 - 2.0 mmol/L   O2 Saturation 83.1 %   Collection site VEIN    Drawn by NURSE    Sample type VEIN     Comment:  Performed at [facility], [address], [place], [place] [address]  I-Stat CG4 Lactic Acid, ED     Status: Abnormal   Collection Time: 02/23/18  2:58 PM  Result Value Ref Range   Lactic Acid, Venous 2.08 (HH) 0.5 - 1.9 mmol/L   Comment NOTIFIED PHYSICIAN   Glucose, capillary     Status: Abnormal   Collection Time: 02/23/18  6:54 PM  Result Value Ref Range   Glucose-Capillary 111 (H) 70 - 99 mg/dL  CBC with Differential/Platelet     Status: Abnormal (Preliminary result)   Collection Time: 02/23/18  8:22 PM  Result Value Ref Range   WBC PENDING 4.0 - 10.5 K/uL   RBC 4.11 3.87 - 5.11 MIL/uL   Hemoglobin 12.4 12.0 - 15.0 g/dL   HCT 52.8 41.3 - 24.4 %   MCV 92.5 80.0 - 100.0 fL   MCH 30.2 26.0 - 34.0 pg   MCHC 32.6 30.0 - 36.0 g/dL   RDW 01.0 (H) 27.2 - 53.6 %   Platelets 173 150 - 400 K/uL    Comment: Performed at [facility], [address], [place], [place] [address]   nRBC PENDING 0.0 - 0.2 %   Neutrophils Relative % PENDING %   Neutro Abs PENDING 1.7 - 7.7 K/uL   Band Neutrophils PENDING %   Lymphocytes Relative PENDING %   Lymphs Abs PENDING 0.7 - 4.0 K/uL   Monocytes Relative PENDING %   Monocytes Absolute PENDING 0.1 - 1.0 K/uL   Eosinophils Relative PENDING %   Eosinophils Absolute PENDING 0.0 -  0.5 K/uL   Basophils Relative PENDING %   Basophils Absolute PENDING 0.0 - 0.1 K/uL   WBC Morphology PENDING    RBC Morphology PENDING    Smear Review PENDING    Other PENDING %   nRBC PENDING 0 /100 WBC   Metamyelocytes Relative PENDING %   Myelocytes PENDING %   Promyelocytes Relative PENDING %   Blasts PENDING %  Hemoglobin A1c     Status: None   Collection Time: 02/23/18  8:27 PM  Result Value Ref Range   Hgb A1c MFr Bld 5.6 4.8 - 5.6 %    Comment: (NOTE) Pre diabetes:          5.7%-6.4% Diabetes:              >6.4% Glycemic control for   <7.0% adults with diabetes    Mean Plasma Glucose 114.02 mg/dL    Comment: Performed at [facility], [address], [place], [place] [address]    Ct Abdomen Pelvis Wo Contrast  Result Date: 02/23/2018 CLINICAL DATA:  Abdominal distension. Coffee-ground emesis. Concern for MS enteric ischemia ruptured aneurysm or perforation. Delayed stool. EXAM: CT ABDOMEN AND PELVIS WITHOUT CONTRAST TECHNIQUE: Multidetector CT imaging of the abdomen and pelvis was performed following the standard protocol without IV contrast. COMPARISON:  None FINDINGS: Lower chest: Lung bases are clear. Hepatobiliary: Small amount pneumobilia in the nondependent LEFT hepatic lobe. Gas is present within the common hepatic duct/common bile duct. There is a poorly defined gallbladder. There is gas within the expected location of the gallbladder fossa and enhancing tissue (image 26/2). This is favored to represent a collapsed gallbladder with intraluminal gas. No significant free fluid.  No intraperitoneal free air. Pancreas: Pancreas is normal. No ductal dilatation. No pancreatic inflammation. Spleen: Normal spleen Adrenals/urinary tract: Adrenal glands and kidneys are normal. The ureters and bladder normal. Stomach/Bowel: The stomach and duodenum are  normal. There is dilatation of the proximal small bowel up to 4 cm best seen on the CT topogram. The dilated small bowel is  fluid and gas filled and extends over a long segment of small bowel into the RIGHT lower quadrant. Within loop of bowel in the RIGHT lower quadrant there is a round 2.2 cm lesion with peripheral calcification in central low-attenuation. This is favored a gallstone which has eroded into the bowel and migrated distally. Proximal to this presumed gallstone the small bowel is obstructed. No intraperitoneal free air. Vascular/Lymphatic: Abdominal aorta is normal caliber. No periportal or retroperitoneal adenopathy. No pelvic adenopathy. Reproductive: Uterus normal Other: Small amount high-density free fluid in the posterior cul-de-sac. Musculoskeletal: No aggressive osseous lesion. IMPRESSION: 1. Findings consistent with "gallstone ileus". A gallstone has eroded from the gallbladder into the proximal small bowel traveling distally and now OBSTRUCTS the distal small bowel. No pneumatosis or portal venous gas. No perforation. 2. Gallbladder is collapsed with small amount of internal gas as well as pneumobilia consistent with cholecysto-enteric fistula 3. Small amount of high-density fluid in the posterior cul-de-sac presumably related to gallstone eroding through the gallbladder wall. 4. Recommend surgical consultation for the small bowel obstruction. Findings conveyed toANKIT NANAVATI on 02/23/2018  at13:04. Electronically Signed   By: Genevive BiStewart  Edmunds M.D.   On: 02/23/2018 13:10   Dg Chest Portable 1 View  Result Date: 02/23/2018 CLINICAL DATA:  Nasogastric tube placement EXAM: PORTABLE CHEST 1 VIEW COMPARISON:  Portable exam at 1550 hrs compared to earlier study of 02/23/2018 FINDINGS: Nasogastric tube extends into stomach. Enlargement of cardiac silhouette. Mediastinal contour stable. LEFT jugular line tip projects over SVC. Slight chronic accentuation of pulmonary markings in the mid to lower lungs. No segmental consolidation, pleural effusion or pneumothorax. IMPRESSION: Nasogastric tube extends into stomach.  Otherwise no change. Electronically Signed   By: Ulyses SouthwardMark  Boles M.D.   On: 02/23/2018 16:07   Dg Chest Portable 1 View  Result Date: 02/23/2018 CLINICAL DATA:  Central line placement EXAM: PORTABLE CHEST 1 VIEW COMPARISON:  02/23/2018 FINDINGS: Left jugular central venous catheter tip in the SVC. No pneumothorax. Heart size upper normal. Pulmonary vascular congestion unchanged. Negative for effusion Right paratracheal soft tissue thickening could represent venous engorgement or adenopathy. Follow-up two-view chest x-ray suggested. IMPRESSION: Central venous catheter tip in the SVC.  No pneumothorax Mild vascular congestion Right paratracheal soft tissue thickening could represent adenopathy. Recommend two-view chest x-ray. Electronically Signed   By: Marlan Palauharles  Clark M.D.   On: 02/23/2018 14:44   Dg Chest Portable 1 View  Result Date: 02/23/2018 CLINICAL DATA:  Coffee ground emesis 3 days. EXAM: PORTABLE CHEST 1 VIEW COMPARISON:  None. FINDINGS: Lungs are hypoinflated without focal airspace consolidation or effusion. There is mild hazy prominence of the central pulmonary vessels likely mild vascular congestion. Cardiomediastinal silhouette and remainder of the exam is unremarkable. IMPRESSION: Suggestion of mild vascular congestion. Electronically Signed   By: Elberta Fortisaniel  Boyle M.D.   On: 02/23/2018 12:58    Review of Systems  Constitutional: Positive for malaise/fatigue. Negative for chills and fever.  Cardiovascular: Negative for chest pain.  Gastrointestinal: Positive for abdominal pain, nausea and vomiting.  Genitourinary: Negative for dysuria.  Neurological: Positive for weakness.   Blood pressure (!) 89/64, pulse 100, temperature 99.5 F (37.5 C), temperature source Oral, resp. rate (!) 23, height 5\' 3"  (1.6 m), weight 91.2 kg, SpO2 99 %. Physical Exam  Constitutional: She is oriented to person, place, and time. She appears well-developed and well-nourished.  She is awake and alert and appears  comfortable  HENT:  Head: Normocephalic and atraumatic.  Right Ear: External ear normal.  Left Ear: External ear normal.  Nose: Nose normal.  Mouth/Throat: No oropharyngeal exudate.  Eyes: Pupils are equal, round, and reactive to light. Right eye exhibits no discharge. Left eye exhibits no discharge. No scleral icterus.  Neck: Normal range of motion. Neck supple. No tracheal deviation present.  Cardiovascular: Regular rhythm and normal heart sounds.  No murmur heard. Tachycardic with regular rhythm  Respiratory: Effort normal and breath sounds normal. No respiratory distress. She exhibits no tenderness.  GI:  Her abdomen is obese.  There is mild diffuse tenderness but no frank peritonitis  Musculoskeletal: Normal range of motion.        General: No tenderness, deformity or edema.  Neurological: She is alert and oriented to person, place, and time.  Skin: Skin is warm and dry. No erythema.  Psychiatric: Her behavior is normal. Judgment normal.    Assessment/Plan: Gallstone ileus with distal small bowel obstruction and sepsis  I have extensively reviewed her CT scan.  There is pneumobilia and the gallbladder is poorly defined.  It is difficult to tell where the gallbladder is visualized to the small bowel.  There is definitely a 2.2 cm gallstone creating obstruction in the distal small bowel. I explained this diagnosis to the patient and her daughter in detail.  Currently, she does not have evidence of ischemic bowel.  She has a nasogastric tube in place and she has resuscitation ongoing.  Her lactic acid level is improving.  I explained to them that the gallstone was unlikely to pass into the colon and she would require surgery to remove the gallstone and relieve the obstruction.  This may be able to be attempted with laparoscopic assistance.  We would not attempt acutely to operate on the gallbladder enteric fistula because of the risk of significant injury to the surrounding structures  including the bile duct.  After surgery to remove the gallstone, we will probably do further imaging with either an MRCP or at least a contrasted CT scan to define the biliary and duodenal anatomy.  They understand and agree with our current treatment and plans.  Abigail Miyamoto 02/23/2018, 9:05 PM

## 2018-02-23 NOTE — Progress Notes (Signed)
Pharmacy Antibiotic Note  Joanna Good is a 67 y.o. female admitted on 02/23/2018 with SBO and fistula.  Pharmacy has been consulted for Cefepime dosing. Pt also on Flagyl. WBC elevated to 15.8. LA 2.08  SCr 3.8, est CrCl ~7015ml/min  Plan: Cefepime 1gm IV q24h Will f/u micro data, renal function, and pt's clinical condition  Height: 5\' 3"  (160 cm) Weight: 201 lb (91.2 kg) IBW/kg (Calculated) : 52.4  Temp (24hrs), Avg:98.6 F (37 C), Min:97.6 F (36.4 C), Max:99.5 F (37.5 C)  Recent Labs  Lab 02/23/18 1158 02/23/18 1253 02/23/18 1257 02/23/18 1458  WBC  --  15.8*  --   --   CREATININE  --  3.73* 3.80*  --   LATICACIDVEN 5.69* 4.2*  --  2.08*    Estimated Creatinine Clearance: 15.4 mL/min (A) (by C-G formula based on SCr of 3.8 mg/dL (H)).    Allergies  Allergen Reactions  . Bee Venom   . Claritin [Loratadine] Nausea And Vomiting  . Ibuprofen   . Sulfonamide Derivatives     Antimicrobials this admission: 12/22 Cefepime >>  12/22 Flagyl >>   Microbiology results: 12/22 BCx:  12/22 MRSA PCR:   Thank you for allowing pharmacy to be a part of this patient's care.  Christoper Fabianaron Neeya Prigmore, PharmD, BCPS Clinical pharmacist  **Pharmacist phone directory can now be found on [email or web address] ([email or web address]).  Listed under [facility]. 02/23/2018 8:17 PM

## 2018-02-23 NOTE — ED Notes (Signed)
Pt is very difficult stick for blood draw and IV access.  Dr. Rhunette CroftNanavati aware and at bedside.

## 2018-02-23 NOTE — ED Notes (Signed)
[facility]  Critical trop 0.06   Both Chari ManningBecca, RN and Dr Dorris CarnesN informed

## 2018-02-23 NOTE — ED Notes (Signed)
Dr. Rhunette CroftNanavati at bedside to attempt central line.  Multiple attempts made at insertion into right IJ without success.  Successful on 2nd attempt to left IJ with positive blood return noted.  Pt tolerated entire procedure time well and remained conversational with staff throughout.

## 2018-02-23 NOTE — ED Notes (Signed)
Central line...

## 2018-02-23 NOTE — ED Notes (Signed)
Report given to Jocelyn with [facility].  

## 2018-02-23 NOTE — ED Notes (Signed)
CRITICAL VALUE ALERT  Critical Value:  Lactic 2.08  Date & Time Notied:  02/23/18, 1500  Provider Notified: dr. Rhunette Croftnanavati  Orders Received/Actions taken: no new orders

## 2018-02-23 NOTE — Procedures (Signed)
Arterial Catheter Insertion Procedure Note Joanna ShutterJanet [name] [id number] Oct 23, 1950  Procedure: Insertion of Arterial Catheter  Indications: Blood pressure monitoring and Frequent blood sampling  Procedure Details Consent: Risks of procedure as well as the alternatives and risks of each were explained to the (patient/caregiver).  Consent for procedure obtained. Time Out: Verified patient identification, verified procedure, site/side was marked, verified correct patient position, special equipment/implants available, medications/allergies/relevent history reviewed, required imaging and test results available.  Performed  Maximum sterile technique was used including antiseptics, cap, gloves, gown, hand hygiene, mask and sheet. Skin prep: Chlorhexidine; local anesthetic administered 20 gauge catheter was inserted into right femoral artery using the Seldinger technique.  Evaluation Blood flow Good; BP tracing Good. Complications: No apparent complications.   Brett CanalesSteve Staton Markey ACNP [facility] Pager [phone] till 3 pm If no answer page [phone] 02/23/2018, 9:50 PM

## 2018-02-23 NOTE — ED Notes (Signed)
Central line placement in process at this time with EDP and two RNs at bedside.

## 2018-02-23 NOTE — ED Triage Notes (Signed)
Emesis of coffee grounds for three days. Pt reports also report tarry stools since early this am.

## 2018-02-23 NOTE — ED Notes (Signed)
Report given to Kim, RN on 3M 

## 2018-02-23 NOTE — Consult Note (Addendum)
..   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   History of present illness   67 yr old female with PMHx significant for Acid reflux, HTN, Nephrolithiasis, Osteopenia presented on 12/22 in AM with complaints of multiple episodes of vomiting that started 12/21 in the evening and this continued into the morning. Patient states that at one point she felt like she was vomiting about every hour for about 4- 5 hrs.  She also reports multiple episodes of very dark stool and she thinks there was probably blood in her stool as well.  The patient denies any recent injury or trauma.  She states that she takes Celebrex.  She denies any other NSAID type medications.  She has not had any recent operations on the abdomen area.  No previous history of gastrointestinal bleeding.  The patient is not on any blood thinners, and has no history of bleeding disorder.  No reported fever or chills recently.   She states that for several weeks she has been on a new diet that is gluten free low carb and high fat. She reports that on 12/19 she had a very rich meal (very fatty). She states that while on this diet when she would have breakfast she would go immediately to the bathroom and have a series of bowel movements. It was only on 12/21 evening she noticed the change in color and possible blood.  Past Medical History  .Marland Kitchen. Past Medical History:  Diagnosis Date  . Acid reflux   . Arthritis   . Cystocele   . Hypertension   . Kidney stones   . Osteopenia 09/2014   T score -2.4 distal  third left radius. Remainder of DEXA normal  . Stroke (HCC)   . Uterine prolapse      Consults:  Surgery - 02/23/18 Dr Magnus IvanBlackman [facility]- 02/23/18  Procedures:  [facility]GT placed at [facility]- 12/22 [facility] CVC at [facility]- 12/22  Significant Diagnostic Tests:  CT ABDOMEN/PELVIS 02/23/18 IMPRESSION: 1. Findings consistent with "gallstone ileus". A gallstone has eroded from the gallbladder into the proximal small bowel traveling distally and now OBSTRUCTS the distal small bowel. No pneumatosis or portal venous gas. No perforation. 2. Gallbladder is collapsed with small amount of internal gas as well as pneumobilia consistent with cholecysto-enteric fistula 3. Small amount of high-density fluid in the posterior cul-de-sac presumably related to gallstone eroding through the gallbladder wall. 4. Recommend surgical consultation for the small bowel obstruction  Micro Data:  Blood cx x 2  Antimicrobials:  Cefepime 12/22 Flagyl 12/22   Interim history/subjective:  Since NGT placed pt states that she feels improved and abdominal pain improved Continues on Levophed. Discontinued Neo that was started in transport by [facility]. Will get Aline.  Objective   Blood pressure (!) 94/45, pulse 98, temperature 99.5 F (37.5 C), temperature source Oral, resp. rate (!) 25, height 5\' 3"  (1.6 m), weight 91.2 kg, SpO2 98 %.        Intake/Output Summary (Last 24 hours) at 02/23/2018 1943 Last data filed at 02/23/2018 1705 Gross  per 24 hour  Intake 4447.99 ml  Output -  Net 4447.99 ml   Filed Weights   02/23/18 1109  Weight: 91.2 kg    Examination: General: obese female in no distress HENT: normocephalic NGT in place Lungs: clear to auscultation bilaterally Cardiovascular: S1 and S2 + systolic murmur on auscultation does not radiate to axilla or carotids 3/6. Abdomen: soft non distended obese abdomen + Murphy's  And exquisitely tender in RUQ RLQ Extremities: no lower ext edema, no signs of atrophy Neuro: GCS  15 AAOx4 GU: no foley.   Assessment & Plan:  1. Septic Shock secondary to intra-abdominal source. CTAP shows a cholecysto-enteric fistula and possible SBO.  Plan: Continue on Cefepime and Flagyl Continue on IVF LA trended down from 5.69-> 4.2 -> 2.08 MAP goal>78mmHg - currently on Vasopressors titrate to this goal A-line to be placed 2D echo to assess LVEF (Last ECHO was 2015 and showed an EF 50-55% with Moderate focal basal septal hypertrophy,grade 1 diastolic dysfunction. Mild to moderate left atrial enlargement. Mildly sclerotic aortic valve.  2. Cholecysto-enteric fistula: gallstone eroding through the gallbladder wall and now obstructing distal bowel with SBO noted.- Consult placed to Surgery discussed case with Dr Magnus Ivan and appreciate his quick call back and recs.  Plan: Continue with NGT for decompression Continue with IVF, Abx and pressors Surgery will see in AM - evaluate if a candidate for intervention at that time to remove the obstructing calculus from the distal bowel.  3. Acute Kidney Injury ( BUN 45/ Cr 3.8) Baseline BUN 11 Cr 0.8- May 2015 Anion Gap Metabolic Acidosis (improving) - LA on presentation 5.69 now 2 - most likely secondary to dehydration and prerenal  Plan: Place indwelling foley catheter Continue IVF Avoid nephrotoxic medications F/u electrolytes replace as needed No need for bicarb at this time - ph 7.395 on last gas  4. UGIB: H/o melena,  FOBT + on admission Hgb stable at 14 INR 1.23 No need for transfusion at this time Plan: CBC Q 12 Will consult GI Continue on Protonix gtt if no additional signs of bleeding will transition to Q12 If Hgb <7 will transfuse PRBCs Pt has no h/o anticoagulation meds. No signs of active bleeding on my exam. And no definitve surgical plan Ok for VTE ppx    Best practice:  Diet: NPO with NGT to intermittent suction Pain/Anxiety/Delirium protocol (if indicated): morphine prn VAP protocol (if indicated):  not indicated  DVT prophylaxis: heparin [place] and SCDs GI prophylaxis: PPI Glucose control: no h/o DM if BG exceeds 180mg /dl will start ISS Mobility: Bedrest Code Status: FULL Family Communication: daughter at bedside Disposition: ICU  Labs   CBC: Recent Labs  Lab 02/23/18 1253 02/23/18 1257  WBC 15.8*  --   NEUTROABS 13.1*  --   HGB 14.0 14.6  HCT 43.9 43.0  MCV 94.8  --   PLT 181  --     Basic Metabolic Panel: Recent Labs  Lab 02/23/18 1253 02/23/18 1257  NA 140 139  K 3.4* 3.5  CL 102 100  CO2 23  --   GLUCOSE 137* 130*  BUN 52* 45*  CREATININE 3.73* 3.80*  CALCIUM 8.7*  --    GFR: Estimated Creatinine Clearance: 15.4 mL/min (A) (by C-G formula based on SCr of 3.8 mg/dL (H)). Recent Labs  Lab 02/23/18 1158 02/23/18 1253 02/23/18 1458  WBC  --  15.8*  --   LATICACIDVEN 5.69* 4.2* 2.08*    Liver Function Tests: Recent Labs  Lab  02/23/18 1253  AST 30  ALT 30  ALKPHOS 49  BILITOT 0.5  PROT 6.5  ALBUMIN 3.3*   Recent Labs  Lab 02/23/18 1200  LIPASE 20   No results for input(s): AMMONIA in the last 168 hours.  ABG    Component Value Date/Time   HCO3 24.4 02/23/2018 1440   TCO2 25 02/23/2018 1257   O2SAT 83.1 02/23/2018 1440     Coagulation Profile: Recent Labs  Lab 02/23/18 1251  INR 1.23    Cardiac Enzymes: Recent Labs  Lab 02/23/18 1257  TROPONINI 0.06*    HbA1C: Hgb A1c MFr Bld  Date/Time Value Ref Range Status  07/30/2013 09:32 AM 5.9 (H) <5.7 % Final    Comment:                                                                           According to the ADA Clinical Practice Recommendations for 2011, when HbA1c is used as a screening test:     >=6.5%   Diagnostic of Diabetes Mellitus            (if abnormal result is confirmed)   5.7-6.4%   Increased risk of developing Diabetes Mellitus   References:Diagnosis and Classification of Diabetes Mellitus,Diabetes Care,2011,34(Suppl 1):S62-S69 and Standards of Medical Care  in         Diabetes - 2011,Diabetes Care,2011,34 (Suppl 1):S11-S61.    02/05/2012 09:24 AM 6.0 (H) <5.7 % Final    Comment:                                                                           According to the ADA Clinical Practice Recommendations for 2011, when HbA1c is used as a screening test:     >=6.5%   Diagnostic of Diabetes Mellitus            (if abnormal result is confirmed)   5.7-6.4%   Increased risk of developing Diabetes Mellitus   References:Diagnosis and Classification of Diabetes Mellitus,Diabetes Care,2011,34(Suppl 1):S62-S69 and Standards of Medical Care in         Diabetes - 2011,Diabetes Care,2011,34 (Suppl 1):S11-S61.      CBG: Recent Labs  Lab 02/23/18 1854  GLUCAP 111*    Review of Systems:   Marland KitchenMarland KitchenReview of Systems  Constitutional: Negative for chills and fever.  HENT: Negative.   Eyes: Negative.   Respiratory: Negative for cough and shortness of breath.   Cardiovascular: Negative for palpitations and leg swelling.  Gastrointestinal: Positive for abdominal pain, blood in stool, diarrhea, melena, nausea and vomiting.  Genitourinary: Negative.   Musculoskeletal: Negative.   Skin: Negative.   Neurological: Negative.   Endo/Heme/Allergies: Negative.      Past Medical History  She,  has a past medical history of Acid reflux, Arthritis, Cystocele, Hypertension, Kidney stones, Osteopenia (09/2014), Stroke [facility]), and Uterine prolapse.   Surgical History    Past Surgical History:  Procedure Laterality Date  . TONSILLECTOMY  Social History   reports that she has never smoked. She has never used smokeless tobacco. She reports current alcohol use of about 1.0 standard drinks of alcohol per week. She reports that she does not use drugs.   Family History   Her family history includes Breast cancer in her maternal aunt and paternal aunt; Diabetes in her father and sister; Heart disease in her father and mother; Hypertension in her father,  mother, and sister.   Allergies Allergies  Allergen Reactions  . Bee Venom   . Claritin [Loratadine] Nausea And Vomiting  . Ibuprofen   . Sulfonamide Derivatives      Home Medications  Prior to Admission medications   Medication Sig Start Date End Date Taking? Authorizing Provider  aspirin EC 325 MG tablet Take 1 tablet (325 mg total) by mouth daily. 07/17/16  Yes Marvel PlanXu, Jindong, MD  Calcium Carbonate-Vitamin D (CALCIUM + D PO) Take by mouth.   Yes [provider]  celecoxib (CELEBREX) 100 MG capsule Take 1 capsule by mouth daily. 07/09/14  Yes [provider]  escitalopram (LEXAPRO) 5 MG tablet Take 1 tablet by mouth daily. 06/14/17  Yes [provider]  esomeprazole (NEXIUM) 20 MG capsule Take 20 mg by mouth daily at 12 noon.   Yes [provider]  ezetimibe (ZETIA) 10 MG tablet Take 10 mg by mouth daily.   Yes [provider]  Multiple Vitamin (MULTIVITAMIN) tablet Take 1 tablet by mouth daily.   Yes [provider]  Omega-3 Fatty Acids (FISH OIL PO) Take by mouth.   Yes [provider]  OVER THE COUNTER MEDICATION    Yes [provider]  triamcinolone (NASACORT AQ) 55 MCG/ACT AERO nasal inhaler Place 2 sprays into the nose daily.   Yes [provider]     Critical care time: 65 mins      I, Dr Newell CoralKristen Raylee Adamec have personally reviewed patient's available data, including medical history, events of note, physical examination and test results as part of my evaluation. I have discussed with other care providers such as surgery, RN and Elink.  The patient is critically ill with multiple organ systems failure and requires high complexity decision making for assessment and support, frequent evaluation and titration of therapies, application of advanced monitoring technologies and extensive interpretation of multiple databases.   Critical Care Time devoted to patient care services described in this note is 65  Minutes. This time reflects time of care of this signee Dr Newell CoralKristen Johnchristopher Sarvis. This critical care time does not reflect procedure time, or teaching time or supervisory time but could involve care discussion time   Dr. Newell CoralKristen Ithiel Liebler [facility]  02/23/2018 8:37 PM

## 2018-02-23 NOTE — ED Notes (Addendum)
Call from lab POC Bobbie  Critical Lactic Acid  4.2  Both Rebecca and Dr Dorris CarnesN apprised

## 2018-02-23 NOTE — ED Notes (Signed)
Pt continues to be alert and oriented with family at bedside.  Pressure responding to fluids.  1st unit of emergency release blood in.

## 2018-02-23 NOTE — Significant Event (Signed)
PCCM EVENT NOTE  Worsening Hypotension despite Levophed gtt HR in 150-160 s in Afib ( new onset) Map in 50s  Femoral A line placed by NP Minor- MAP 65 SBP 78 CVP 8- waveform poor BUN 50 Cr 3.42 Na 136  ABG from previous   Assessment and Plan: Given Sepsis from intra-abdominal source IVF resuscitation continued - (per EDP documentation she got 3L at [facility]) No need for a repeat ABG- AG closed on last BMET and Bicarb 22 and lactate trended down.  VBG from 2 pm 7.395/41/50/24 Most likely when pt goes into Afib she looses additional 25% output and she become hypotensive. Will fluid resuscitate her then reassess Will give 2L bolus LR  Start on Hydrocortisone  50 Q 6  At bedside - MAP improving    Signed Dr Newell CoralKristen Vincenzina Jagoda [facility] Loc[facility]

## 2018-02-23 NOTE — Progress Notes (Signed)
Arterial line placement unsucessful only one attempt. Patient is alert and oriented. Patient appears to be in A-fib, patient is asymptomatic. HR in the 160's-170's. Very weak and thready pulse, hard to locate even with the doppler. Blood pressure is soft systolic in the 60's with a map in the 50's. RN aware

## 2018-02-23 NOTE — ED Notes (Signed)
4760f ng tube inserted into right nare on 2nd attempt without difficulty.  Initial measurement difficult due to body habitus, positive black/brown gastric contents.  Xray to confirm and adjust.

## 2018-02-23 NOTE — Progress Notes (Signed)
[facility] Physician-Brief Progress Note Patient Name: Marin ShutterJanet T Chipley DOB: June 04, 1950 MRN: [id number]   Date of Service  02/23/2018  HPI/Events of Note  Hypotension - BP = 64/51 and MAP = 56. Norepinephrine IV infusion at ceiling of 40 mcg/min.   eICU Interventions  Will order: 1. ABG STAT. 2. Increase ceiling of Norepinephrine IV infusion to 60 mcg/min.  3. Monitor CVP now and Q 4 hours.  4. Vasopressin IV infusion to run at shock dose.      Intervention Category Major Interventions: Hypotension - evaluation and management  Lenell AntuSommer,Kin Galbraith Eugene 02/23/2018, 8:57 PM

## 2018-02-23 NOTE — ED Notes (Signed)
Spoke with [facility] will send truck for transport.

## 2018-02-23 NOTE — ED Provider Notes (Signed)
[facility] EMERGENCY DEPARTMENT Provider Note   CSN: [id number] Arrival date & time: 02/23/18  1106     History   Chief Complaint Chief Complaint  Patient presents with  . GI Bleeding    HPI Joanna DUBA is a 67 y.o. female.  HPI 67 year old female comes in with chief complaint of GI bleeding.  Level 5 caveat for hypertension and acutely ill condition.  Patient has history of hypertension, mild stroke and she comes to the ER with chief complaint of weakness and nausea with vomiting.  According to patient's friend, patient started getting sick about 2 days ago.  She has been having nausea with vomiting and loose bowel movements.  Patient has had about 5-10 episodes of vomiting over the past 24 hours.  Emesis has been dark.  She is also had 5-10 dark bowel movements.  Patient has not seen frank blood in her stools or in her emesis.  Patient denies any abdominal pain, recent fevers, chills, UTI-like symptoms, new cough, flulike symptoms.  No history of abdominal surgeries.  No history of GI bleed.  Patient takes full dose aspirin daily and intermittently takes NSAIDs.   Past Medical History:  Diagnosis Date  . Acid reflux   . Arthritis   . Cystocele   . Hypertension   . Kidney stones   . Osteopenia 09/2014   T score -2.4 distal third left radius. Remainder of DEXA normal  . Stroke (HCC)   . Uterine prolapse     Patient Active Problem List   Diagnosis Date Noted  . 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 Kidney stones   . Uterine prolapse   . Cystocele   . HYPERLIPIDEMIA TYPE IIB / III 12/22/2008    Past Surgical History:  Procedure Laterality Date  . TONSILLECTOMY       OB History    Gravida  2   Para  2   Term  2   Preterm      AB      Living  2     SAB      TAB      Ectopic      Multiple      Live Births               Home Medications    Prior to Admission medications   Medication Sig Start Date End Date Taking? Authorizing Provider    aspirin EC 325 MG tablet Take 1 tablet (325 mg total) by mouth daily. 07/17/16  Yes Marvel Plan, MD  Calcium Carbonate-Vitamin D (CALCIUM + D PO) Take by mouth.   Yes [provider]  celecoxib (CELEBREX) 100 MG capsule Take 1 capsule by mouth daily. 07/09/14  Yes [provider]  escitalopram (LEXAPRO) 5 MG tablet Take 1 tablet by mouth daily. 06/14/17  Yes [provider]  esomeprazole (NEXIUM) 20 MG capsule Take 20 mg by mouth daily at 12 noon.   Yes [provider]  ezetimibe (ZETIA) 10 MG tablet Take 10 mg by mouth daily.   Yes [provider]  Multiple Vitamin (MULTIVITAMIN) tablet Take 1 tablet by mouth daily.   Yes [provider]  Omega-3 Fatty Acids (FISH OIL PO) Take by mouth.   Yes [provider]  OVER THE COUNTER MEDICATION    Yes [provider]  triamcinolone (NASACORT AQ) 55 MCG/ACT AERO nasal inhaler Place 2 sprays into the nose daily.   Yes [provider]    Family History  Family History  Problem Relation Age of Onset  . Heart disease Mother   . Hypertension Mother   . Diabetes Father   . Heart disease Father   . Hypertension Father   . Diabetes Sister   . Hypertension Sister   . Breast cancer Maternal Aunt        Age 40  . Breast cancer Paternal Aunt        Age 78    Social History Social History   Tobacco Use  . Smoking status: Never Smoker  . Smokeless tobacco: Never Used  Substance Use Topics  . Alcohol use: Yes    Alcohol/week: 1.0 standard drinks    Types: 1 Glasses of wine per week    Comment: Rare  . Drug use: No     Allergies   Bee venom; Claritin [loratadine]; Ibuprofen; and Sulfonamide derivatives   Review of Systems Review of Systems  Unable to perform ROS: Acuity of condition  All other systems reviewed and are negative.    Physical Exam Updated Vital Signs BP (!) 71/61   Pulse 93   Temp 97.6 F (36.4 C) (Oral)   Resp (!) 23   Ht 5\' 3"  (1.6 m)    Wt 91.2 kg   SpO2 96%   BMI 35.61 kg/m   Physical Exam Vitals signs and nursing note reviewed.  Constitutional:      General: She is in acute distress.     Appearance: She is well-developed. She is ill-appearing and toxic-appearing.  HENT:     Head: Atraumatic.  Neck:     Musculoskeletal: Normal range of motion and neck supple.  Cardiovascular:     Rate and Rhythm: Normal rate.  Pulmonary:     Effort: Pulmonary effort is normal.  Abdominal:     General: There is distension.     Tenderness: There is abdominal tenderness. There is guarding and rebound.  Skin:    Coloration: Skin is pale.  Neurological:     Comments: Somnolent but responds appropriately to all questions      ED Treatments / Results  Labs (all labs ordered are listed, but only abnormal results are displayed) Labs Reviewed  PROTIME-INR - Abnormal; Notable for the following components:      Result Value   Prothrombin Time 15.4 (*)    All other components within normal limits  CBC WITH DIFFERENTIAL/PLATELET - Abnormal; Notable for the following components:   WBC 15.8 (*)    RDW 15.8 (*)    Neutro Abs 13.1 (*)    Abs Immature Granulocytes 0.17 (*)    All other components within normal limits  COMPREHENSIVE METABOLIC PANEL - Abnormal; Notable for the following components:   Potassium 3.4 (*)    Glucose, Bld 137 (*)    BUN 52 (*)    Creatinine, Ser 3.73 (*)    Calcium 8.7 (*)    Albumin 3.3 (*)    GFR calc non Af Amer 12 (*)    GFR calc Af Amer 14 (*)    All other components within normal limits  TROPONIN I - Abnormal; Notable for the following components:   Troponin I 0.06 (*)    All other components within normal limits  LACTIC ACID, PLASMA - Abnormal; Notable for the following components:   Lactic Acid, Venous 4.2 (*)    All other components within normal limits  BLOOD GAS, VENOUS - Abnormal; Notable for the following components:   pCO2, Ven 41.4 (*)    pO2,  Ven 50.7 (*)    All other components  within normal limits  I-STAT CHEM 8, ED - Abnormal; Notable for the following components:   BUN 45 (*)    Creatinine, Ser 3.80 (*)    Glucose, Bld 130 (*)    Calcium, Ion 1.11 (*)    All other components within normal limits  I-STAT CG4 LACTIC ACID, ED - Abnormal; Notable for the following components:   Lactic Acid, Venous 5.69 (*)    All other components within normal limits  POC OCCULT BLOOD, ED - Abnormal; Notable for the following components:   Fecal Occult Bld POSITIVE (*)    All other components within normal limits  I-STAT CG4 LACTIC ACID, ED - Abnormal; Notable for the following components:   Lactic Acid, Venous 2.08 (*)    All other components within normal limits  CULTURE, BLOOD (ROUTINE X 2)  CULTURE, BLOOD (ROUTINE X 2)  LIPASE, BLOOD  APTT  CBC WITH DIFFERENTIAL/PLATELET  URINALYSIS, ROUTINE W REFLEX MICROSCOPIC  I-STAT CG4 LACTIC ACID, ED  TYPE AND SCREEN  ABO/RH  PREPARE RBC (CROSSMATCH)    EKG EKG Interpretation  Date/Time:  Sunday February 23 2018 11:17:54 EST Ventricular Rate:  94 PR Interval:    QRS Duration: 89 QT Interval:  364 QTC Calculation: 456 R Axis:   -41 Text Interpretation:  Sinus rhythm Abnormal R-wave progression, early transition Left ventricular hypertrophy No acute changes No old tracing to compare Confirmed by Derwood Kaplan [phone]) on 02/23/2018 12:17:14 PM   Radiology Ct Abdomen Pelvis Wo Contrast  Result Date: 02/23/2018 CLINICAL DATA:  Abdominal distension. Coffee-ground emesis. Concern for MS enteric ischemia ruptured aneurysm or perforation. Delayed stool. EXAM: CT ABDOMEN AND PELVIS WITHOUT CONTRAST TECHNIQUE: Multidetector CT imaging of the abdomen and pelvis was performed following the standard protocol without IV contrast. COMPARISON:  None FINDINGS: Lower chest: Lung bases are clear. Hepatobiliary: Small amount pneumobilia in the nondependent LEFT hepatic lobe. Gas is present within the common hepatic duct/common bile duct.  There is a poorly defined gallbladder. There is gas within the expected location of the gallbladder fossa and enhancing tissue (image 26/2). This is favored to represent a collapsed gallbladder with intraluminal gas. No significant free fluid.  No intraperitoneal free air. Pancreas: Pancreas is normal. No ductal dilatation. No pancreatic inflammation. Spleen: Normal spleen Adrenals/urinary tract: Adrenal glands and kidneys are normal. The ureters and bladder normal. Stomach/Bowel: The stomach and duodenum are normal. There is dilatation of the proximal small bowel up to 4 cm best seen on the CT topogram. The dilated small bowel is fluid and gas filled and extends over a long segment of small bowel into the RIGHT lower quadrant. Within loop of bowel in the RIGHT lower quadrant there is a round 2.2 cm lesion with peripheral calcification in central low-attenuation. This is favored a gallstone which has eroded into the bowel and migrated distally. Proximal to this presumed gallstone the small bowel is obstructed. No intraperitoneal free air. Vascular/Lymphatic: Abdominal aorta is normal caliber. No periportal or retroperitoneal adenopathy. No pelvic adenopathy. Reproductive: Uterus normal Other: Small amount high-density free fluid in the posterior cul-de-sac. Musculoskeletal: No aggressive osseous lesion. IMPRESSION: 1. Findings consistent with "gallstone ileus". A gallstone has eroded from the gallbladder into the proximal small bowel traveling distally and now OBSTRUCTS the distal small bowel. No pneumatosis or portal venous gas. No perforation. 2. Gallbladder is collapsed with small amount of internal gas as well as pneumobilia consistent with cholecysto-enteric fistula 3. Small amount of  high-density fluid in the posterior cul-de-sac presumably related to gallstone eroding through the gallbladder wall. 4. Recommend surgical consultation for the small bowel obstruction. Findings conveyed toANKIT Dariyon Urquilla on  02/23/2018  at13:04. Electronically Signed   By: Genevive BiStewart  Edmunds M.D.   On: 02/23/2018 13:10   Dg Chest Portable 1 View  Result Date: 02/23/2018 CLINICAL DATA:  Central line placement EXAM: PORTABLE CHEST 1 VIEW COMPARISON:  02/23/2018 FINDINGS: Left jugular central venous catheter tip in the SVC. No pneumothorax. Heart size upper normal. Pulmonary vascular congestion unchanged. Negative for effusion Right paratracheal soft tissue thickening could represent venous engorgement or adenopathy. Follow-up two-view chest x-ray suggested. IMPRESSION: Central venous catheter tip in the SVC.  No pneumothorax Mild vascular congestion Right paratracheal soft tissue thickening could represent adenopathy. Recommend two-view chest x-ray. Electronically Signed   By: Marlan Palauharles  Clark M.D.   On: 02/23/2018 14:44   Dg Chest Portable 1 View  Result Date: 02/23/2018 CLINICAL DATA:  Coffee ground emesis 3 days. EXAM: PORTABLE CHEST 1 VIEW COMPARISON:  None. FINDINGS: Lungs are hypoinflated without focal airspace consolidation or effusion. There is mild hazy prominence of the central pulmonary vessels likely mild vascular congestion. Cardiomediastinal silhouette and remainder of the exam is unremarkable. IMPRESSION: Suggestion of mild vascular congestion. Electronically Signed   By: Elberta Fortisaniel  Boyle M.D.   On: 02/23/2018 12:58    Procedures .Critical Care Performed by: Derwood KaplanNanavati, Luciano Cinquemani, MD Authorized by: Derwood KaplanNanavati, Nuvia Hileman, MD   Critical care provider statement:    Critical care time (minutes):  95   Critical care start time:  02/23/2018 12:40 PM   Critical care end time:  02/23/2018 3:47 PM   Critical care time was exclusive of:  Separately billable procedures and treating other patients   Critical care was necessary to treat or prevent imminent or life-threatening deterioration of the following conditions:  Shock and circulatory failure   Critical care was time spent personally by me on the following activities:   Discussions with consultants, evaluation of patient's response to treatment, examination of patient, ordering and performing treatments and interventions, ordering and review of laboratory studies, ordering and review of radiographic studies, pulse oximetry, re-evaluation of patient's condition, obtaining history from patient or surrogate, review of old charts, blood draw for specimens and development of treatment plan with patient or surrogate   I assumed direction of critical care for this patient from another provider in my specialty: yes   .Central Line Date/Time: 02/23/2018 3:35 PM Performed by: Derwood KaplanNanavati, Otilia Kareem, MD Authorized by: Derwood KaplanNanavati, Akhila Mahnken, MD   Consent:    Consent obtained:  Written   Consent given by:  Patient   Risks discussed:  Arterial puncture, bleeding, infection, pneumothorax and incorrect placement   Alternatives discussed:  No treatment Universal protocol:    Procedure explained and questions answered to patient or proxy's satisfaction: yes     Relevant documents present and verified: yes     Test results available and properly labeled: yes     Imaging studies available: yes     Required blood products, implants, devices, and special equipment available: yes     Site/side marked: yes     Immediately prior to procedure, a time out was called: yes     Patient identity confirmed:  Arm band Pre-procedure details:    Hand hygiene: Hand hygiene performed prior to insertion     Sterile barrier technique: All elements of maximal sterile technique followed     Skin preparation:  2% chlorhexidine   Skin preparation agent:  Skin preparation agent completely dried prior to procedure   Anesthesia (see MAR for exact dosages):    Anesthesia method:  Local infiltration   Local anesthetic:  Lidocaine 1% WITH epi Procedure details:    Location:  L internal jugular   Site selection rationale:  Failed right sided internal jugular   (including critical care time)  Medications  Ordered in ED Medications  pantoprazole (PROTONIX) 80 mg in sodium chloride 0.9 % 250 mL (0.32 mg/mL) infusion (8 mg/hr Intravenous New Bag/Given 02/23/18 1246)  norepinephrine (LEVOPHED) 4 mg in dextrose 5 % 250 mL (0.016 mg/mL) infusion (2 mcg/min Intravenous New Bag/Given 02/23/18 1519)  ceFEPIme (MAXIPIME) 1 g in sodium chloride 0.9 % 100 mL IVPB (0 g Intravenous Stopped 02/23/18 1454)    And  metroNIDAZOLE (FLAGYL) IVPB 500 mg (has no administration in time range)  lactated ringers bolus 1,000 mL (has no administration in time range)  lidocaine (XYLOCAINE) 2 % jelly (has no administration in time range)  Benzocaine (HURRCAINE) 20 % mouth spray (has no administration in time range)  pantoprazole (PROTONIX) 80 mg in sodium chloride 0.9 % 100 mL IVPB (0 mg Intravenous Stopped 02/23/18 1234)  ondansetron (ZOFRAN) injection 4 mg (4 mg Intravenous Given 02/23/18 1312)  sodium chloride 0.9 % bolus 2,500 mL (0 mLs Intravenous Stopped 02/23/18 1432)     Initial Impression / Assessment and Plan / ED Course  I have reviewed the triage vital signs and the nursing notes.  Pertinent labs & imaging results that were available during my care of the patient were reviewed by me and considered in my medical decision making (see chart for details).  Clinical Course as of Feb 23 1546  Sun Feb 23, 2018  1528 Sepsis reassessment completed. Patient has now received more than 3 L of IV fluid, which includes 2-1/2 L of normal saline ordered here and does not find 8 cc saline that was given by EMS.   [AN]  1529 Patient continues to remain hypotensive.  Norepinephrine has been started. I will order 1 more liter of lactated Ringer.  BP(!): 71/61 [AN]  1529 Sepsis - Repeat Assessment  Performed at:    3:15 pm  Vitals     @VITALSNOTREFRESHABLE @ Heart:     Regular rate and rhythm Lungs:    Rales Capillary Refill:   <2 sec Peripheral Pulse:     Skin:     Normal Color       [AN]  1530 Re-discussed  case with Dr. Lovell Sheehan.  Dr. Lovell Sheehan, general surgery had spoken with hospitalist already.  They do not think patient is stable for AP admission.  Dr. Lovell Sheehan also stated that there is no acute role for surgery.  He is aware of the pneumobilia.  With his recommendation, will defer surgical consultation at [facility] to critical care.  I have consulted critical care for admission -call pending.   [AN]  1541 Critical care excepting.   [AN]    Clinical Course User Index [AN] Derwood Kaplan, MD    Patient arrives to the ER in shock. She is hypotensive, pale and complaining of several episodes of vomiting and diarrhea.  Vomitus has been dark -coffee-ground and the stools are dark as well.  Abdomen is noted to be distended and patient appears to have generalized tenderness with peritonitis.  Initial differential diagnosis includes perforated viscus, AAA rupture, massive GI bleed, mesenteric ischemia due to low blood pressure. Bedside ultrasound revealed normal-appearing aorta in the periumbilical region, exam limited significantly because  of gas and tenderness. Patient had received a liter of fluid and her pressure did not get better.  In the interim, the nurses were unable to draw blood.  IV fluids were functioning however, therefore we decided to start 1 unit of O- blood presuming massive bleed and take patient to the CT scan.  A noncontrasted study was done with me being with the patient for the entire time along with crash cart.  Patient tolerated the CT scan well.  CT scan ruled out AAA.  I spoke with general surgery who informed me that patient has pneumobilia along with gallstone ileus. I discussed the case with Dr. Lovell SheehanJenkins.  He was aware of the pneumobilia along with gallstone ileus and the patient being in shock.  He informed me that the treatment for this is conservative and to decompress the abdomen.  We proceeded to resuscitate patient further.  A central line was placed.  Labs revealed  initial lactate that was over 5.  Over time the lactic acidosis improved.  Patient's blood pressure also improved initially, however it started dropping again. NorEpinephrine infusion was began, and critical care was called for admission.  Patient is full code. Family is aware of the condition.    Final Clinical Impressions(s) / ED Diagnoses   Final diagnoses:  Shock (HCC)  Gallstone ileus (HCC)  Pneumobilia    ED Discharge Orders    None       Derwood KaplanNanavati, Lucca Greggs, MD 02/23/18 1547

## 2018-02-23 NOTE — ED Provider Notes (Signed)
[facility] EMERGENCY DEPARTMENT Provider Note   CSN: [id number] Arrival date & time: 02/23/18  1106     History   Chief Complaint Chief Complaint  Patient presents with  . GI Bleeding    HPI Joanna Good is a 67 y.o. female.  Patient is a 67 year old female who presents to the emergency department with a complaint of GI bleeding.  Level 5 caveat due to advancing medical condition.  Patient arrived to the emergency department by EMS because of GI bleeding and weakness.  The patient states that she had multiple episodes of vomiting on last night and this continued on this morning.  She also had multiple episodes of very dark stool and she thinks there was probably blood in her stool as well.  The patient denies any recent injury or trauma.  She states that she takes Celebrex.  She denies any other NSAID type medications.  She has not had any recent operations on the abdomen area.  Patient states that one point she felt like she was vomiting about every hour for about 4 to 5hours.  No previous history of gastrointestinal bleeding.  The patient is not on any blood thinners, and has no history of bleeding disorder.  No reported fever or chills recently.  Nausea, vomiting, nor diarrhea is aggravated by eating.  The history is provided by the patient.    Past Medical History:  Diagnosis Date  . Acid reflux   . Arthritis   . Cystocele   . Hypertension   . Kidney stones   . Osteopenia 09/2014   T score -2.4 distal third left radius. Remainder of DEXA normal  . Stroke (HCC)   . Uterine prolapse     Patient Active Problem List   Diagnosis Date Noted  . 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 Kidney stones   . Uterine prolapse   . Cystocele   . HYPERLIPIDEMIA TYPE IIB / III 12/22/2008    Past Surgical History:  Procedure Laterality Date  . TONSILLECTOMY       OB History    Gravida  2   Para  2   Term  2   Preterm      AB      Living  2     SAB      TAB      Ectopic      Multiple      Live Births               Home Medications    Prior to Admission medications   Medication Sig Start Date End Date Taking? Authorizing Provider  aspirin EC 325 MG tablet Take 1 tablet (325 mg total) by mouth daily. 07/17/16   Marvel Plan, MD  Calcium Carbonate-Vitamin D (CALCIUM + D PO) Take by mouth.    [provider]  celecoxib (CELEBREX) 100 MG capsule Take 1 capsule by mouth daily. 07/09/14   [provider]  esomeprazole (NEXIUM) 20 MG capsule Take 20 mg by mouth daily at 12 noon.    [provider]  ezetimibe (ZETIA) 10 MG tablet Take 10 mg by mouth daily.    [provider]  Multiple Vitamin (MULTIVITAMIN) tablet Take 1 tablet by mouth daily.    [provider]  NONFORMULARY OR COMPOUNDED ITEM EYE DROPS    [provider]  Omega-3 Fatty Acids (FISH OIL PO) Take by mouth.    [provider]  OVER THE COUNTER MEDICATION  [provider]  triamcinolone (NASACORT AQ) 55 MCG/ACT AERO nasal inhaler Place 2 sprays into the nose daily.    [provider]    Family History Family History  Problem Relation Age of Onset  . Heart disease Mother   . Hypertension Mother   . Diabetes Father   . Heart disease Father   . Hypertension Father   . Diabetes Sister   . Hypertension Sister   . Breast cancer Maternal Aunt        Age 64  . Breast cancer Paternal Aunt        Age 97    Social History Social History   Tobacco Use  . Smoking status: Never Smoker  . Smokeless tobacco: Never Used  Substance Use Topics  . Alcohol use: Yes    Alcohol/week: 1.0 standard drinks    Types: 1 Glasses of wine per week    Comment: Rare  . Drug use: No     Allergies   Bee venom; Claritin [loratadine]; Ibuprofen; and Sulfonamide derivatives   Review of Systems Review of Systems  Constitutional: Positive for activity change. Negative for chills and fever.       All ROS Neg except  as noted in HPI  HENT: Positive for congestion. Negative for nosebleeds.   Eyes: Negative for photophobia and discharge.  Respiratory: Negative for cough, shortness of breath and wheezing.   Cardiovascular: Negative for chest pain and palpitations.  Gastrointestinal: Positive for abdominal pain, blood in stool, diarrhea, nausea and vomiting.  Genitourinary: Negative for dysuria, frequency and hematuria.  Musculoskeletal: Positive for arthralgias. Negative for back pain and neck pain.  Skin: Negative.   Neurological: Positive for weakness. Negative for dizziness, seizures and speech difficulty.  Psychiatric/Behavioral: Negative for confusion and hallucinations.     Physical Exam Updated Vital Signs BP (!) 64/33 (BP Location: Right Arm)   Pulse 93   Temp 97.6 F (36.4 C) (Oral)   Resp 20   Ht 5\' 3"  (1.6 m)   Wt 91.2 kg   SpO2 96%   BMI 35.61 kg/m   Physical Exam Vitals signs and nursing note reviewed.  Constitutional:      Appearance: She is well-developed. She is ill-appearing and toxic-appearing.  HENT:     Head: Normocephalic.     Right Ear: Tympanic membrane and external ear normal.     Left Ear: Tympanic membrane and external ear normal.  Eyes:     General: Lids are normal.     Pupils: Pupils are equal, round, and reactive to light.  Neck:     Musculoskeletal: Normal range of motion and neck supple.     Vascular: No carotid bruit.  Cardiovascular:     Rate and Rhythm: Regular rhythm.     Pulses: Normal pulses.     Heart sounds: Normal heart sounds.     Comments: Distal pulses of the feet difficult to feel. Pulmonary:     Effort: No respiratory distress.     Breath sounds: Normal breath sounds.  Abdominal:     General: Bowel sounds are normal.     Palpations: Abdomen is soft.     Tenderness: There is generalized abdominal tenderness. There is no guarding.       Comments: There is diffuse generalized tenderness of the abdomen.  At abdomen and tenderness is  enhanced at the epigastric area, as well as the lower abdomen above the suprapubic area.  Bowel sounds are present.  There is pain with even light  palpation, and there is pain with change of position.  No pulsating mass appreciated.  However this examination is limited because of the patient's pain.  Genitourinary:    Comments: Rectal: Blood on gloved finger Musculoskeletal: Normal range of motion.  Lymphadenopathy:     Head:     Right side of head: No submandibular adenopathy.     Left side of head: No submandibular adenopathy.     Cervical: No cervical adenopathy.  Skin:    General: Skin is warm and dry.     Coloration: Skin is pale.  Neurological:     Mental Status: She is alert and oriented to person, place, and time.     Cranial Nerves: No cranial nerve deficit.     Sensory: No sensory deficit.  Psychiatric:        Speech: Speech normal.      ED Treatments / Results  Labs (all labs ordered are listed, but only abnormal results are displayed) Labs Reviewed - No data to display  EKG None  Radiology No results found.  Procedures Procedures (including critical care time) CRITICAL CARE Performed by: Ivery QualeHobson Regina Coppolino Total critical care time: **35* minutes Critical care time was exclusive of separately billable procedures and treating other patients. Critical care was necessary to treat or prevent imminent or life-threatening deterioration. Critical care was time spent personally by me on the following activities: development of treatment plan with patient and/or surrogate as well as nursing, discussions with consultants, evaluation of patient's response to treatment, examination of patient, obtaining history from patient or surrogate, ordering and performing treatments and interventions, ordering and review of laboratory studies, ordering and review of radiographic studies, pulse oximetry and re-evaluation of patient's condition. Medications Ordered in ED Medications - No data  to display   Initial Impression / Assessment and Plan / ED Course  I have reviewed the triage vital signs and the nursing notes.  Pertinent labs & imaging results that were available during my care of the patient were reviewed by me and considered in my medical decision making (see chart for details).  Clinical Course as of Feb 23 1713  Wynelle LinkSun Feb 23, 2018  1528 Sepsis reassessment completed. Patient has now received more than 3 L of IV fluid, which includes 2-1/2 L of normal saline ordered here and does not find 8 cc saline that was given by EMS.   [AN]  1529 Patient continues to remain hypotensive.  Norepinephrine has been started. I will order 1 more liter of lactated Ringer.  BP(!): 71/61 [AN]  1529 Sepsis - Repeat Assessment  Performed at:    3:15 pm  Vitals     @VITALSNOTREFRESHABLE @ Heart:     Regular rate and rhythm Lungs:    Rales Capillary Refill:   <2 sec Peripheral Pulse:     Skin:     Normal Color       [AN]  1530 Re-discussed case with Dr. Lovell SheehanJenkins.  Dr. Lovell SheehanJenkins, general surgery had spoken with hospitalist already.  They do not think patient is stable for AP admission.  Dr. Lovell SheehanJenkins also stated that there is no acute role for surgery.  He is aware of the pneumobilia.  With his recommendation, [facility] critical [facility].  I have consulted critical care for admission -call pending.   [AN]  1541 Critical care excepting.   [AN]    Clinical Course User Index [AN] Derwood KaplanNanavati, Ankit, MD      Final Clinical Impressions(s) / ED  Diagnoses MDM  Blood pressure 64 systolic.    2 iv's requested. Pt has blood noted on rectal exam. IV protonix ordered.  I discussed with family that pt in critically ill, and answered questions as best possible.  Pt discussed and seen with me by DR Rhunette CroftNanavati. Blood pressure continues to be low. Emergency blood ordered by Dr Rhunette Croftnanavati.  Lactic elevated over 5. Bedside ultrasound by Dr Rhunette CroftNanavati.  Pt  accompanied to CT by myself and  Dr Rhunette CroftNanavati.  Blood pressure remains low. IV norepinephrine ordered. Pt tolerating blood without problem.  Pt's care to be continued by Dr Rhunette CroftNanavati.   Final diagnoses:  Shock (HCC)  Gallstone ileus [facility](HCC)  Pneumobilia    ED Discharge Orders    None       Ivery QualeBryant, Zenith Lamphier, PA-C 02/23/18 1733    Derwood KaplanNanavati, Ankit, MD 02/24/18 [phone]

## 2018-02-23 NOTE — ED Notes (Signed)
SPO2 will not data validate from monitor in room.  Sats have been 100% during entire ED stay.

## 2018-02-24 ENCOUNTER — Inpatient Hospital Stay (HOSPITAL_COMMUNITY): Payer: Medicare Other

## 2018-02-24 ENCOUNTER — Encounter (HOSPITAL_COMMUNITY)
Admission: EM | Disposition: A | Discharge status: Home / Self Care | Payer: Self-pay | Source: Home / Self Care | Attending: Internal Medicine

## 2018-02-24 ENCOUNTER — Encounter (HOSPITAL_COMMUNITY): Payer: Self-pay | Admitting: Anesthesiology

## 2018-02-24 ENCOUNTER — Other Ambulatory Visit (HOSPITAL_COMMUNITY): Payer: Medicare Other

## 2018-02-24 ENCOUNTER — Inpatient Hospital Stay (HOSPITAL_COMMUNITY): Payer: Medicare Other | Admitting: Anesthesiology

## 2018-02-24 DIAGNOSIS — R194 Change in bowel habit: Secondary | ICD-10-CM

## 2018-02-24 HISTORY — PX: COLON RESECTION: SHX5231

## 2018-02-24 LAB — CBC
HCT: 34.4 % — ABNORMAL LOW (ref 36.0–46.0)
Hemoglobin: 10.8 g/dL — ABNORMAL LOW (ref 12.0–15.0)
MCH: 29.9 pg (ref 26.0–34.0)
MCHC: 31.4 g/dL (ref 30.0–36.0)
MCV: 95.3 fL (ref 80.0–100.0)
Platelets: 124 10*3/uL — ABNORMAL LOW (ref 150–400)
RBC: 3.61 MIL/uL — ABNORMAL LOW (ref 3.87–5.11)
RDW: 16.2 % — ABNORMAL HIGH (ref 11.5–15.5)
WBC: 6.8 10*3/uL (ref 4.0–10.5)
nRBC: 0 % (ref 0.0–0.2)

## 2018-02-24 LAB — TYPE AND SCREEN
ABO/RH(D): O POS
Antibody Screen: NEGATIVE
Unit division: 0
Unit division: 0

## 2018-02-24 LAB — LACTIC ACID, PLASMA: LACTIC ACID, VENOUS: 1.1 mmol/L (ref 0.5–1.9)

## 2018-02-24 LAB — GLUCOSE, CAPILLARY
Glucose-Capillary: 122 mg/dL — ABNORMAL HIGH (ref 70–99)
Glucose-Capillary: 126 mg/dL — ABNORMAL HIGH (ref 70–99)
Glucose-Capillary: 91 mg/dL (ref 70–99)
Glucose-Capillary: 94 mg/dL (ref 70–99)
Glucose-Capillary: 95 mg/dL (ref 70–99)
Glucose-Capillary: 99 mg/dL (ref 70–99)

## 2018-02-24 LAB — CBC WITH DIFFERENTIAL/PLATELET
Abs Immature Granulocytes: 0.11 10*3/uL — ABNORMAL HIGH (ref 0.00–0.07)
BASOS PCT: 0 %
Basophils Absolute: 0 10*3/uL (ref 0.0–0.1)
Eosinophils Absolute: 1.9 10*3/uL — ABNORMAL HIGH (ref 0.0–0.5)
Eosinophils Relative: 19 %
HCT: 35.5 % — ABNORMAL LOW (ref 36.0–46.0)
Hemoglobin: 11.4 g/dL — ABNORMAL LOW (ref 12.0–15.0)
Immature Granulocytes: 1 %
Lymphocytes Relative: 9 %
Lymphs Abs: 0.9 10*3/uL (ref 0.7–4.0)
MCH: 29.8 pg (ref 26.0–34.0)
MCHC: 32.1 g/dL (ref 30.0–36.0)
MCV: 92.7 fL (ref 80.0–100.0)
Monocytes Absolute: 0.5 10*3/uL (ref 0.1–1.0)
Monocytes Relative: 5 %
Neutro Abs: 6.3 10*3/uL (ref 1.7–7.7)
Neutrophils Relative %: 66 %
Platelets: 145 10*3/uL — ABNORMAL LOW (ref 150–400)
RBC: 3.83 MIL/uL — ABNORMAL LOW (ref 3.87–5.11)
RDW: 16.1 % — AB (ref 11.5–15.5)
WBC: 9.7 10*3/uL (ref 4.0–10.5)
nRBC: 0 % (ref 0.0–0.2)

## 2018-02-24 LAB — BPAM RBC
Blood Product Expiration Date: 202001162359
Blood Product Expiration Date: 202001162359
ISSUE DATE / TIME: 201912221204
ISSUE DATE / TIME: 201912221204
Unit Type and Rh: 9500
Unit Type and Rh: 9500

## 2018-02-24 LAB — COMPREHENSIVE METABOLIC PANEL
ALK PHOS: 42 U/L (ref 38–126)
ALT: 28 U/L (ref 0–44)
AST: 30 U/L (ref 15–41)
Albumin: 2.3 g/dL — ABNORMAL LOW (ref 3.5–5.0)
Anion gap: 7 (ref 5–15)
BUN: 44 mg/dL — AB (ref 8–23)
CO2: 23 mmol/L (ref 22–32)
CREATININE: 2.6 mg/dL — AB (ref 0.44–1.00)
Calcium: 8.1 mg/dL — ABNORMAL LOW (ref 8.9–10.3)
Chloride: 107 mmol/L (ref 98–111)
GFR calc Af Amer: 21 mL/min — ABNORMAL LOW (ref >60)
GFR calc non Af Amer: 18 mL/min — ABNORMAL LOW (ref >60)
Glucose, Bld: 190 mg/dL — ABNORMAL HIGH (ref 70–99)
Potassium: 3.8 mmol/L (ref 3.5–5.1)
Sodium: 137 mmol/L (ref 135–145)
Total Bilirubin: 1 mg/dL (ref 0.3–1.2)
Total Protein: 5.1 g/dL — ABNORMAL LOW (ref 6.5–8.1)

## 2018-02-24 LAB — ABO/RH: ABO/RH(D): O POS

## 2018-02-24 LAB — MAGNESIUM: Magnesium: 2.1 mg/dL (ref 1.7–2.4)

## 2018-02-24 SURGERY — COLON RESECTION LAPAROSCOPIC
Anesthesia: General

## 2018-02-24 MED ORDER — SODIUM CHLORIDE 0.9 % IV SOLN
INTRAVENOUS | Status: DC | PRN
  Administered 2018-02-24: 25 ug/min via INTRAVENOUS

## 2018-02-24 MED ORDER — INSULIN ASPART 100 UNIT/ML ~~LOC~~ SOLN
1.0000 [IU] | SUBCUTANEOUS | Status: DC
  Administered 2018-02-24 (×2): 1 [IU] via SUBCUTANEOUS

## 2018-02-24 MED ORDER — MEPERIDINE HCL 50 MG/ML IJ SOLN: 6.2500 mg | INTRAMUSCULAR | Status: DC | PRN

## 2018-02-24 MED ORDER — SCOPOLAMINE 1 MG/3DAYS TD PT72
MEDICATED_PATCH | TRANSDERMAL | Status: AC
  Administered 2018-02-24: 1.5 mg via TRANSDERMAL
  Filled 2018-02-24: qty 1

## 2018-02-24 MED ORDER — PROPOFOL 10 MG/ML IV BOLUS
INTRAVENOUS | Status: DC | PRN
  Administered 2018-02-24: 150 mg via INTRAVENOUS

## 2018-02-24 MED ORDER — INFLUENZA VAC SPLIT HIGH-DOSE 0.5 ML IM SUSY: 0.5000 mL | PREFILLED_SYRINGE | INTRAMUSCULAR | Status: DC | PRN

## 2018-02-24 MED ORDER — HYDROMORPHONE HCL 1 MG/ML IJ SOLN
INTRAMUSCULAR | Status: AC
  Filled 2018-02-24: qty 1

## 2018-02-24 MED ORDER — LIDOCAINE 2% (20 MG/ML) 5 ML SYRINGE
INTRAMUSCULAR | Status: DC | PRN
  Administered 2018-02-24: 100 mg via INTRAVENOUS

## 2018-02-24 MED ORDER — PNEUMOCOCCAL VAC POLYVALENT 25 MCG/0.5ML IJ INJ: 0.5000 mL | INJECTION | INTRAMUSCULAR | Status: DC | PRN

## 2018-02-24 MED ORDER — PROPOFOL 10 MG/ML IV BOLUS
INTRAVENOUS | Status: AC
  Filled 2018-02-24: qty 20

## 2018-02-24 MED ORDER — MIDAZOLAM HCL 5 MG/5ML IJ SOLN
INTRAMUSCULAR | Status: DC | PRN
  Administered 2018-02-24: 1 mg via INTRAVENOUS

## 2018-02-24 MED ORDER — BUPIVACAINE-EPINEPHRINE 0.25% -1:200000 IJ SOLN
INTRAMUSCULAR | Status: DC | PRN
  Administered 2018-02-24: 14 mL

## 2018-02-24 MED ORDER — ACETAMINOPHEN 10 MG/ML IV SOLN
1000.0000 mg | Freq: Once | INTRAVENOUS | Status: DC | PRN
  Administered 2018-02-24: 1000 mg via INTRAVENOUS

## 2018-02-24 MED ORDER — SCOPOLAMINE 1 MG/3DAYS TD PT72
1.0000 | MEDICATED_PATCH | TRANSDERMAL | Status: DC
  Administered 2018-02-24 – 2018-02-27 (×2): 1.5 mg via TRANSDERMAL
  Filled 2018-02-24 (×2): qty 1

## 2018-02-24 MED ORDER — ONDANSETRON HCL 4 MG/2ML IJ SOLN
INTRAMUSCULAR | Status: AC
  Administered 2018-02-24: 4 mg via INTRAVENOUS
  Filled 2018-02-24: qty 2

## 2018-02-24 MED ORDER — SUGAMMADEX SODIUM 200 MG/2ML IV SOLN
INTRAVENOUS | Status: DC | PRN
  Administered 2018-02-24: 200 mg via INTRAVENOUS

## 2018-02-24 MED ORDER — MIDAZOLAM HCL 2 MG/2ML IJ SOLN
INTRAMUSCULAR | Status: AC
  Filled 2018-02-24: qty 2

## 2018-02-24 MED ORDER — FENTANYL CITRATE (PF) 100 MCG/2ML IJ SOLN
INTRAMUSCULAR | Status: DC | PRN
  Administered 2018-02-24 (×3): 50 ug via INTRAVENOUS

## 2018-02-24 MED ORDER — PROMETHAZINE HCL 25 MG/ML IJ SOLN: 6.2500 mg | INTRAMUSCULAR | Status: DC | PRN

## 2018-02-24 MED ORDER — ARTIFICIAL TEARS OPHTHALMIC OINT
TOPICAL_OINTMENT | OPHTHALMIC | Status: AC
  Filled 2018-02-24: qty 3.5

## 2018-02-24 MED ORDER — SODIUM CHLORIDE 0.9 % IV SOLN: INTRAVENOUS | Status: DC

## 2018-02-24 MED ORDER — ROCURONIUM BROMIDE 50 MG/5ML IV SOSY
PREFILLED_SYRINGE | INTRAVENOUS | Status: DC | PRN
  Administered 2018-02-24: 30 mg via INTRAVENOUS

## 2018-02-24 MED ORDER — PANTOPRAZOLE SODIUM 40 MG IV SOLR
40.0000 mg | Freq: Two times a day (BID) | INTRAVENOUS | Status: DC
  Administered 2018-02-24 – 2018-02-28 (×10): 40 mg via INTRAVENOUS
  Filled 2018-02-24 (×11): qty 40

## 2018-02-24 MED ORDER — ACETAMINOPHEN 10 MG/ML IV SOLN
INTRAVENOUS | Status: AC
  Filled 2018-02-24: qty 100

## 2018-02-24 MED ORDER — DEXAMETHASONE SODIUM PHOSPHATE 10 MG/ML IJ SOLN
INTRAMUSCULAR | Status: DC | PRN
  Administered 2018-02-24: 5 mg via INTRAVENOUS

## 2018-02-24 MED ORDER — FENTANYL CITRATE (PF) 100 MCG/2ML IJ SOLN
25.0000 ug | INTRAMUSCULAR | Status: DC | PRN
  Administered 2018-02-24 – 2018-02-28 (×11): 25 ug via INTRAVENOUS
  Filled 2018-02-24 (×10): qty 2

## 2018-02-24 MED ORDER — HYDROMORPHONE HCL 1 MG/ML IJ SOLN: 0.5000 mg | INTRAMUSCULAR | Status: DC | PRN

## 2018-02-24 MED ORDER — FENTANYL CITRATE (PF) 100 MCG/2ML IJ SOLN
INTRAMUSCULAR | Status: AC
  Filled 2018-02-24: qty 2

## 2018-02-24 MED ORDER — 0.9 % SODIUM CHLORIDE (POUR BTL) OPTIME
TOPICAL | Status: DC | PRN
  Administered 2018-02-24: 1000 mL

## 2018-02-24 MED ORDER — DEXAMETHASONE SODIUM PHOSPHATE 10 MG/ML IJ SOLN
INTRAMUSCULAR | Status: AC
  Filled 2018-02-24: qty 1

## 2018-02-24 MED ORDER — ONDANSETRON HCL 4 MG/2ML IJ SOLN
4.0000 mg | Freq: Once | INTRAMUSCULAR | Status: AC
  Administered 2018-02-24: 4 mg via INTRAVENOUS
  Filled 2018-02-24: qty 2

## 2018-02-24 MED ORDER — SODIUM CHLORIDE 0.9 % IV SOLN
INTRAVENOUS | Status: DC | PRN
  Administered 2018-02-24: 10:00:00 via INTRAVENOUS

## 2018-02-24 MED ORDER — FENTANYL CITRATE (PF) 250 MCG/5ML IJ SOLN
INTRAMUSCULAR | Status: AC
  Filled 2018-02-24: qty 5

## 2018-02-24 MED ORDER — LACTATED RINGERS IV BOLUS
1000.0000 mL | Freq: Once | INTRAVENOUS | Status: AC
  Administered 2018-02-24: 1000 mL via INTRAVENOUS

## 2018-02-24 MED ORDER — HYDROMORPHONE HCL 1 MG/ML IJ SOLN
0.2500 mg | INTRAMUSCULAR | Status: DC | PRN
  Administered 2018-02-24 (×4): 0.5 mg via INTRAVENOUS

## 2018-02-24 MED ORDER — HYDROCODONE-ACETAMINOPHEN 7.5-325 MG PO TABS: 1.0000 | ORAL_TABLET | Freq: Once | ORAL | Status: DC | PRN

## 2018-02-24 MED ORDER — SUCCINYLCHOLINE CHLORIDE 20 MG/ML IJ SOLN
INTRAMUSCULAR | Status: DC | PRN
  Administered 2018-02-24: 100 mg via INTRAVENOUS

## 2018-02-24 SURGICAL SUPPLY — 68 items
APPLIER CLIP 5 13 M/L LIGAMAX5 (MISCELLANEOUS)
APPLIER CLIP ROT 10 11.4 M/L (STAPLE)
APR CLP MED LRG 11.4X10 (STAPLE)
APR CLP MED LRG 5 ANG JAW (MISCELLANEOUS)
BLADE CLIPPER SURG (BLADE) IMPLANT
CANISTER SUCT 3000ML PPV (MISCELLANEOUS) ×3 IMPLANT
CELLS DAT CNTRL 66122 CELL SVR (MISCELLANEOUS) IMPLANT
CLIP APPLIE 5 13 M/L LIGAMAX5 (MISCELLANEOUS) IMPLANT
CLIP APPLIE ROT 10 11.4 M/L (STAPLE) IMPLANT
COVER SURGICAL LIGHT HANDLE (MISCELLANEOUS) ×6 IMPLANT
COVER WAND RF STERILE (DRAPES) ×1 IMPLANT
DRAPE WARM FLUID 44X44 (DRAPE) ×3 IMPLANT
DRSG OPSITE POSTOP 4X10 (GAUZE/BANDAGES/DRESSINGS) IMPLANT
DRSG OPSITE POSTOP 4X8 (GAUZE/BANDAGES/DRESSINGS) IMPLANT
ELECT CAUTERY BLADE 6.4 (BLADE) ×4 IMPLANT
ELECT REM PT RETURN 9FT ADLT (ELECTROSURGICAL) ×3
ELECTRODE REM PT RTRN 9FT ADLT (ELECTROSURGICAL) ×1 IMPLANT
GEL ULTRASOUND 20GR AQUASONIC (MISCELLANEOUS) IMPLANT
GLOVE BIO SURGEON STRL SZ8 (GLOVE) ×6 IMPLANT
GLOVE BIOGEL PI IND STRL 7.0 (GLOVE) IMPLANT
GLOVE BIOGEL PI IND STRL 8 (GLOVE) ×2 IMPLANT
GLOVE BIOGEL PI INDICATOR 7.0 (GLOVE) ×2
GLOVE BIOGEL PI INDICATOR 8 (GLOVE) ×4
GLOVE SURG SS PI 7.0 STRL IVOR (GLOVE) ×6 IMPLANT
GOWN STRL REUS W/ TWL LRG LVL3 (GOWN DISPOSABLE) ×6 IMPLANT
GOWN STRL REUS W/ TWL XL LVL3 (GOWN DISPOSABLE) ×2 IMPLANT
GOWN STRL REUS W/TWL LRG LVL3 (GOWN DISPOSABLE) ×18
GOWN STRL REUS W/TWL XL LVL3 (GOWN DISPOSABLE) ×6
KIT TURNOVER KIT B (KITS) ×3 IMPLANT
LIGASURE IMPACT 36 18CM CVD LR (INSTRUMENTS) IMPLANT
NEEDLE 22X1 1/2 (OR ONLY) (NEEDLE) ×3 IMPLANT
NS IRRIG 1000ML POUR BTL (IV SOLUTION) ×6 IMPLANT
PACK COLON (CUSTOM PROCEDURE TRAY) ×3 IMPLANT
PAD ARMBOARD 7.5X6 YLW CONV (MISCELLANEOUS) ×6 IMPLANT
PENCIL BUTTON HOLSTER BLD 10FT (ELECTRODE) ×4 IMPLANT
RETRACTOR WND ALEXIS 18 MED (MISCELLANEOUS) IMPLANT
RTRCTR WOUND ALEXIS 18CM MED (MISCELLANEOUS)
SCISSORS LAP 5X35 DISP (ENDOMECHANICALS) ×3 IMPLANT
SET IRRIG TUBING LAPAROSCOPIC (IRRIGATION / IRRIGATOR) IMPLANT
SHEARS HARMONIC ACE PLUS 36CM (ENDOMECHANICALS) ×1 IMPLANT
SLEEVE ENDOPATH XCEL 5M (ENDOMECHANICALS) ×1 IMPLANT
SPECIMEN JAR LARGE (MISCELLANEOUS) ×3 IMPLANT
STAPLER VISISTAT 35W (STAPLE) ×3 IMPLANT
SURGILUBE 2OZ TUBE FLIPTOP (MISCELLANEOUS) IMPLANT
SUT PDS AB 1 TP1 96 (SUTURE) ×6 IMPLANT
SUT PROLENE 2 0 CT2 30 (SUTURE) IMPLANT
SUT PROLENE 2 0 KS (SUTURE) IMPLANT
SUT SILK 2 0 (SUTURE) ×3
SUT SILK 2 0 SH CR/8 (SUTURE) ×3 IMPLANT
SUT SILK 2-0 18XBRD TIE 12 (SUTURE) ×1 IMPLANT
SUT SILK 3 0 (SUTURE) ×3
SUT SILK 3 0 SH CR/8 (SUTURE) ×3 IMPLANT
SUT SILK 3-0 18XBRD TIE 12 (SUTURE) ×1 IMPLANT
SUT VIC AB 2-0 SH 27 (SUTURE) ×3
SUT VIC AB 2-0 SH 27XBRD (SUTURE) IMPLANT
SYS LAPSCP GELPORT 120MM (MISCELLANEOUS)
SYSTEM LAPSCP GELPORT 120MM (MISCELLANEOUS) IMPLANT
TRAY FOLEY MTR SLVR 16FR STAT (SET/KITS/TRAYS/PACK) IMPLANT
TRAY PROCTOSCOPIC FIBER OPTIC (SET/KITS/TRAYS/PACK) IMPLANT
TROCAR XCEL BLUNT TIP 100MML (ENDOMECHANICALS) ×2 IMPLANT
TROCAR XCEL NON-BLD 11X100MML (ENDOMECHANICALS) IMPLANT
TROCAR XCEL NON-BLD 5MMX100MML (ENDOMECHANICALS) ×3 IMPLANT
TUBE CONNECTING 12'X1/4 (SUCTIONS) ×1
TUBE CONNECTING 12X1/4 (SUCTIONS) ×3 IMPLANT
TUBING INSUF HEATED (TUBING) ×3 IMPLANT
TUBING INSUFFLATION (TUBING) ×3 IMPLANT
WATER STERILE IRR 1000ML POUR (IV SOLUTION) ×3 IMPLANT
YANKAUER SUCT BULB TIP NO VENT (SUCTIONS) ×4 IMPLANT

## 2018-02-24 NOTE — Transfer of Care (Signed)
Immediate Anesthesia Transfer of Care Note  Patient: Joanna ShutterJanet [name]  Procedure(s) Performed: LAPAROSCOPIC ASSISTED/SMALL BOWEL RESECTION convereted to open  (N/A )  Patient Location: PACU  Anesthesia Type:General  Level of Consciousness: awake, alert  and oriented  Airway & Oxygen Therapy: Patient Spontanous Breathing and Patient connected to face mask oxygen  Post-op Assessment: Report given to RN, Post -op Vital signs reviewed and stable and Patient moving all extremities X 4  Post vital signs: Reviewed and stable  Last Vitals:  Vitals Value Taken Time  BP    Temp    Pulse    Resp    SpO2      Last Pain:  Vitals:   02/24/18 0806  TempSrc: Oral  PainSc:          Complications: No apparent anesthesia complications

## 2018-02-24 NOTE — Anesthesia Procedure Notes (Signed)
Procedure Name: Intubation Date/Time: 02/24/2018 9:27 AM Performed by: Kyung Rudd, CRNA Pre-anesthesia Checklist: Patient identified, Emergency Drugs available, Suction available, Patient being monitored and Timeout performed Patient Re-evaluated:Patient Re-evaluated prior to induction Oxygen Delivery Method: Circle system utilized Preoxygenation: Pre-oxygenation with 100% oxygen Induction Type: Rapid sequence and Cricoid Pressure applied Laryngoscope Size: Glidescope and 4 Tube type: Oral Tube size: 7.0 mm Number of attempts: 2 Airway Equipment and Method: Video-laryngoscopy and Stylet Placement Confirmation: positive ETCO2 and breath sounds checked- equal and bilateral Secured at: 21 cm Tube secured with: Tape Dental Injury: Teeth and Oropharynx as per pre-operative assessment  Comments: DL x1 with MAC 3, unable to visualize vocal cords by CRNA. AOI using Glidescope, Grade III view on screen.

## 2018-02-24 NOTE — Consult Note (Signed)
Referring Provider:  Dr. Carlota Raspberry, locums for PCCM Primary Care Physician:  Benita Stabile, MD Primary Gastroenterologist:  None, unassigned  Reason for Consultation:  Melena  HPI: Joanna Good is a 67 y.o. female who was transferred here from [facility] with a diagnosis of gallstone ileus.  She presents with a 2-day history of nausea, vomiting, abdominal pain.  There were reports of dark stools at home as well as a large amount of dark colored emesis associated with her illness over the past couple of days.  GI was consulted for those complaints.  She actually had surgery this morning where she underwent LAPAROSCOPIC EXPLORATION, LAPAROTOMY WITH ENTEROTOMY AND REMOVAL OF 2 GALLSTONES FROM THE ILEUM.  Never had EGD or colonoscopy in the past.  Appears that she has a history of reflux and was on nexium 20 mg daily at home.    Here she is currently on pantoprazole 40 mg IV BID.  She describes the stools as being a dark brown/greenish color.  Her Hgb was around 14 grams on admission and is down to 11.4 grams this AM.  No sign of bleeding since her admission.   Past Medical History:  Diagnosis Date  . Acid reflux   . Arthritis   . Cystocele   . Hypertension   . Kidney stones   . Osteopenia 09/2014   T score -2.4 distal third left radius. Remainder of DEXA normal  . Stroke (HCC)   . Uterine prolapse     Past Surgical History:  Procedure Laterality Date  . TONSILLECTOMY      Prior to Admission medications   Medication Sig Start Date End Date Taking? Authorizing Provider  aspirin EC 325 MG tablet Take 1 tablet (325 mg total) by mouth daily. 07/17/16  Yes Marvel Plan, MD  Calcium Carbonate-Vitamin D (CALCIUM + D PO) Take by mouth.   Yes [provider]  celecoxib (CELEBREX) 100 MG capsule Take 1 capsule by mouth daily. 07/09/14  Yes [provider]  escitalopram (LEXAPRO) 5 MG tablet Take 1 tablet by mouth daily. 06/14/17  Yes [provider]    esomeprazole (NEXIUM) 20 MG capsule Take 20 mg by mouth daily at 12 noon.   Yes [provider]  ezetimibe (ZETIA) 10 MG tablet Take 10 mg by mouth daily.   Yes [provider]  Multiple Vitamin (MULTIVITAMIN) tablet Take 1 tablet by mouth daily.   Yes [provider]  Omega-3 Fatty Acids (FISH OIL PO) Take by mouth.   Yes [provider]  OVER THE COUNTER MEDICATION    Yes [provider]  triamcinolone (NASACORT AQ) 55 MCG/ACT AERO nasal inhaler Place 2 sprays into the nose daily.   Yes [provider]    Current Facility-Administered Medications  Medication Dose Route Frequency Provider Last Rate Last Dose  . 0.9 %  sodium chloride infusion   Intra-arterial PRN Scatliffe, Kristen D, MD      . 0.9 %  sodium chloride infusion   Intravenous Continuous Trevor Iha, MD      . ceFEPIme (MAXIPIME) 1 g in sodium chloride 0.9 % 100 mL IVPB  1 g Intravenous Q24H Titus Mould, RPH 200 mL/hr at 02/24/18 1339 1 g at 02/24/18 1339  . heparin injection 5,000 Units  5,000 Units Subcutaneous Q8H Scatliffe, Kristen D, MD   5,000 Units at 02/24/18 1328  . hydrocortisone sodium succinate (SOLU-CORTEF) 100 MG injection 50 mg  50 mg Intravenous Q6H Scatliffe, [employer]  D, MD   50 mg at 02/24/18 0327  . Influenza vac split quadrivalent PF (FLUZONE HIGH-DOSE) injection 0.5 mL  0.5 mL Intramuscular Prior to discharge Scatliffe, Kristen D, MD      . insulin aspart (novoLOG) injection 1-3 Units  1-3 Units Subcutaneous Q4H Scatliffe, Gypsy BalsamKristen D, MD   1 Units at 02/24/18 0721  . lactated ringers infusion   Intravenous Continuous Scatliffe, Gypsy BalsamKristen D, MD 75 mL/hr at 02/24/18 1154    . metroNIDAZOLE (FLAGYL) IVPB 500 mg  500 mg Intravenous Q8H Scatliffe, Kristen D, MD 100 mL/hr at 02/24/18 0800    . norepinephrine (LEVOPHED) 16 mg in D5W 250mL premix infusion  0-60 mcg/min Intravenous Titrated Karl ItoSommer, Steven E, MD   Stopped at 02/24/18 [phone]  . pantoprazole  (PROTONIX) injection 40 mg  40 mg Intravenous Q12H Scatliffe, Gypsy BalsamKristen D, MD   40 mg at 02/24/18 0021  . pneumococcal 23 valent vaccine (PNU-IMMUNE) injection 0.5 mL  0.5 mL Intramuscular Prior to discharge Scatliffe, Kristen D, MD      . scopolamine (TRANSDERM-SCOP) 1 MG/3DAYS 1.5 mg  1 patch Transdermal Q72H Trevor IhaHouser, Stephen A, MD   1.5 mg at 02/24/18 0853  . vasopressin (PITRESSIN) 40 Units in sodium chloride 0.9 % 250 mL (0.16 Units/mL) infusion  0.03 Units/min Intravenous Continuous Karl ItoSommer, Steven E, MD   Stopped at 02/24/18 0550    Allergies as of 02/23/2018 - Review Complete 02/23/2018  Allergen Reaction Noted  . Bee venom  01/24/2012  . Claritin [loratadine] Nausea And Vomiting 07/30/2013  . Ibuprofen    . Sulfonamide derivatives      Family History  Problem Relation Age of Onset  . Heart disease Mother   . Hypertension Mother   . Diabetes Father   . Heart disease Father   . Hypertension Father   . Diabetes Sister   . Hypertension Sister   . Breast cancer Maternal Aunt        Age 67  . Breast cancer Paternal Aunt        Age 67    Social History   Socioeconomic History  . Marital status: Widowed    Spouse name: Not on file  . Number of children: 2  . Years of education: MASTERS  . Highest education level: Not on file  Occupational History  . Not on file  Social Needs  . Financial resource strain: Not on file  . Food insecurity:    Worry: Not on file    Inability: Not on file  . Transportation needs:    Medical: Not on file    Non-medical: Not on file  Tobacco Use  . Smoking status: Never Smoker  . Smokeless tobacco: Never Used  Substance and Sexual Activity  . Alcohol use: Yes    Alcohol/week: 1.0 standard drinks    Types: 1 Glasses of wine per week    Comment: Rare  . Drug use: No  . Sexual activity: Never    Birth control/protection: Post-menopausal    Comment: INTERCOURSE AGE 6, SEXUAL PARTNERS LESS THAN 5  Lifestyle  . Physical activity:    Days  per week: Not on file    Minutes per session: Not on file  . Stress: Not on file  Relationships  . Social connections:    Talks on phone: Not on file    Gets together: Not on file    Attends religious service: Not on file    Active member of club or organization: Not on file  Attends meetings of clubs or organizations: Not on file    Relationship status: Not on file  . Intimate partner violence:    Fear of current or ex partner: Not on file    Emotionally abused: Not on file    Physically abused: Not on file    Forced sexual activity: Not on file  Other Topics Concern  . Not on file  Social History Narrative   Patient is married with 2 children.   Patient is right handed.   Patient has her Masters degree.   Patient drinks 2 cups daily.    Review of Systems: ROS is negative except as mentioned in HPI.  Physical Exam: Vital signs in last 24 hours: Temp:  [98.1 F (36.7 C)-99.5 F (37.5 C)] 98.1 F (36.7 C) (12/23 1124) Pulse Rate:  [33-145] 65 (12/23 1400) Resp:  [11-33] 12 (12/23 1400) BP: (69-121)/(32-69) 84/50 (12/23 1400) SpO2:  [84 %-100 %] 96 % (12/23 1400) Arterial Line BP: (61-129)/(40-74) 85/40 (12/23 1400) Last BM Date: 02/23/18 General:  Alert, Well-developed, well-nourished, pleasant and cooperative in NAD; still groggy from surgery. Head:  Normocephalic and atraumatic. Eyes:  Sclera clear, no icterus.  Conjunctiva pink. Ears:  Normal auditory acuity. Mouth:  No deformity or lesions.   Lungs:  Clear throughout to auscultation.  No wheezes, crackles, or rhonchi.  Heart:  Regular rate and rhythm; no murmurs, clicks, rubs, or gallops. Abdomen:  Soft, non-distended.  No bowel sounds noted.  Small laparotomy incision noted on lower abdomen.  Appropriate TTP. Msk:  Symmetrical without gross deformities. Pulses:  Normal pulses noted. Extremities:  Without clubbing or edema. Neurologic:  Alert and oriented x 4; grossly normal neurologically. Skin:  Intact  without significant lesions or rashes. Psych:  Alert and cooperative. Normal mood and affect.  Intake/Output from previous day: 12/22 0701 - 12/23 0700 In: 1610.9 [I.V.:2765.2; IV Piggyback:4204.1] Out: 1050 [Urine:850; Emesis/NG output:200] Intake/Output this shift: Total I/O In: 1081.7 [I.V.:987.1; IV Piggyback:94.5] Out: 575 [Urine:375; Other:150; Blood:50]  Lab Results: Recent Labs    02/23/18 1253 02/23/18 1257 02/23/18 2022 02/24/18 0317  WBC 15.8*  --  12.6* 9.7  HGB 14.0 14.6 12.4 11.4*  HCT 43.9 43.0 38.0 35.5*  PLT 181  --  173 145*   BMET Recent Labs    02/23/18 1253 02/23/18 1257 02/23/18 2022 02/24/18 0317  NA 140 139 136 137  K 3.4* 3.5 3.0* 3.8  CL 102 100 102 107  CO2 23  --  22 23  GLUCOSE 137* 130* 169* 190*  BUN 52* 45* 50* 44*  CREATININE 3.73* 3.80* 3.42* 2.60*  CALCIUM 8.7*  --  7.9* 8.1*   LFT Recent Labs    02/24/18 0317  PROT 5.1*  ALBUMIN 2.3*  AST 30  ALT 28  ALKPHOS 42  BILITOT 1.0   PT/INR Recent Labs    02/23/18 1251  LABPROT 15.4*  INR 1.23   Studies/Results: Ct Abdomen Pelvis Wo Contrast  Result Date: 02/23/2018 CLINICAL DATA:  Abdominal distension. Coffee-ground emesis. Concern for MS enteric ischemia ruptured aneurysm or perforation. Delayed stool. EXAM: CT ABDOMEN AND PELVIS WITHOUT CONTRAST TECHNIQUE: Multidetector CT imaging of the abdomen and pelvis was performed following the standard protocol without IV contrast. COMPARISON:  None FINDINGS: Lower chest: Lung bases are clear. Hepatobiliary: Small amount pneumobilia in the nondependent LEFT hepatic lobe. Gas is present within the common hepatic duct/common bile duct. There is a poorly defined gallbladder. There is gas within the expected location of the gallbladder fossa  and enhancing tissue (image 26/2). This is favored to represent a collapsed gallbladder with intraluminal gas. No significant free fluid.  No intraperitoneal free air. Pancreas: Pancreas is normal. No  ductal dilatation. No pancreatic inflammation. Spleen: Normal spleen Adrenals/urinary tract: Adrenal glands and kidneys are normal. The ureters and bladder normal. Stomach/Bowel: The stomach and duodenum are normal. There is dilatation of the proximal small bowel up to 4 cm best seen on the CT topogram. The dilated small bowel is fluid and gas filled and extends over a long segment of small bowel into the RIGHT lower quadrant. Within loop of bowel in the RIGHT lower quadrant there is a round 2.2 cm lesion with peripheral calcification in central low-attenuation. This is favored a gallstone which has eroded into the bowel and migrated distally. Proximal to this presumed gallstone the small bowel is obstructed. No intraperitoneal free air. Vascular/Lymphatic: Abdominal aorta is normal caliber. No periportal or retroperitoneal adenopathy. No pelvic adenopathy. Reproductive: Uterus normal Other: Small amount high-density free fluid in the posterior cul-de-sac. Musculoskeletal: No aggressive osseous lesion. IMPRESSION: 1. Findings consistent with "gallstone ileus". A gallstone has eroded from the gallbladder into the proximal small bowel traveling distally and now OBSTRUCTS the distal small bowel. No pneumatosis or portal venous gas. No perforation. 2. Gallbladder is collapsed with small amount of internal gas as well as pneumobilia consistent with cholecysto-enteric fistula 3. Small amount of high-density fluid in the posterior cul-de-sac presumably related to gallstone eroding through the gallbladder wall. 4. Recommend surgical consultation for the small bowel obstruction. Findings conveyed toANKIT NANAVATI on 02/23/2018  at13:04. Electronically Signed   By: Genevive BiStewart  Edmunds M.D.   On: 02/23/2018 13:10   Dg Chest Portable 1 View  Result Date: 02/23/2018 CLINICAL DATA:  Nasogastric tube placement EXAM: PORTABLE CHEST 1 VIEW COMPARISON:  Portable exam at 1550 hrs compared to earlier study of 02/23/2018 FINDINGS:  Nasogastric tube extends into stomach. Enlargement of cardiac silhouette. Mediastinal contour stable. LEFT jugular line tip projects over SVC. Slight chronic accentuation of pulmonary markings in the mid to lower lungs. No segmental consolidation, pleural effusion or pneumothorax. IMPRESSION: Nasogastric tube extends into stomach. Otherwise no change. Electronically Signed   By: Ulyses SouthwardMark  Boles M.D.   On: 02/23/2018 16:07   Dg Chest Portable 1 View  Result Date: 02/23/2018 CLINICAL DATA:  Central line placement EXAM: PORTABLE CHEST 1 VIEW COMPARISON:  02/23/2018 FINDINGS: Left jugular central venous catheter tip in the SVC. No pneumothorax. Heart size upper normal. Pulmonary vascular congestion unchanged. Negative for effusion Right paratracheal soft tissue thickening could represent venous engorgement or adenopathy. Follow-up two-view chest x-ray suggested. IMPRESSION: Central venous catheter tip in the SVC.  No pneumothorax Mild vascular congestion Right paratracheal soft tissue thickening could represent adenopathy. Recommend two-view chest x-ray. Electronically Signed   By: Marlan Palauharles  Clark M.D.   On: 02/23/2018 14:44   Dg Chest Portable 1 View  Result Date: 02/23/2018 CLINICAL DATA:  Coffee ground emesis 3 days. EXAM: PORTABLE CHEST 1 VIEW COMPARISON:  None. FINDINGS: Lungs are hypoinflated without focal airspace consolidation or effusion. There is mild hazy prominence of the central pulmonary vessels likely mild vascular congestion. Cardiomediastinal silhouette and remainder of the exam is unremarkable. IMPRESSION: Suggestion of mild vascular congestion. Electronically Signed   By: Elberta Fortisaniel  Boyle M.D.   On: 02/23/2018 12:58   Dg Abd Portable 1v  Result Date: 02/24/2018 CLINICAL DATA:  Gallstone ileus EXAM: PORTABLE ABDOMEN - 1 VIEW COMPARISON:  CT of the abdomen and pelvis 02/23/2018 FINDINGS: NG  tube is in the stomach. The stomach is decompressed. Multiple loops of small bowel remain dilated. There is  no free air. Degenerative changes are noted in the spine and hips. IMPRESSION: 1. Persistent dilation of small bowel. 2. Decompression of the stomach following placement of NG tube. Electronically Signed   By: Marin Roberts M.D.   On: 02/24/2018 06:41   IMPRESSION:  *Dark emesis and dark stools (describes dark brown-greenish, not black stools):  Likely all related to her gallstone ileus issue.  Could have induced some esophagitis from vomiting as well. *Gallstone ileus:  Went for surgery this morning and had LAPAROSCOPIC EXPLORATION, LAPAROTOMY WITH ENTEROTOMY AND REMOVAL OF 2 GALLSTONES FROM THE ILEUM.  *Mild drop in Hgb, likely dilutional.  PLAN: *Continue pantoprazole 40 mg IV BID for now. *Monitor Hgb. *No plans for endoscopy at this time as it does not seem that she had any significant GI bleeding issue.  Feel free to call back if she has any signs of active bleeding.   Princella Pellegrini. Shaneika Rossa  02/24/2018, 2:16 PM

## 2018-02-24 NOTE — Progress Notes (Signed)
..   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   Hospital course   12/22 admitted, sepsis/shock.  Antibiotics started, IV hydration initiated surgical consult requested 12/23 To OR  laparoscopic exploration w/ surgical findings of gallstone ileus w/ removal of 2 gallstones from distal ileus return to the ICU hypotensive pressors resumed  Past Medical History  Acid reflux, arthritis, cystocele, HTN, renal stones.    Consults:  Surgery - 02/23/18 Dr Magnus IvanBlackman GI- 02/23/18  Procedures:  NGT placed at [facility]- 12/22 [facility] CVC at [facility]- 12/22  Significant Diagnostic Tests:  CT ABDOMEN/PELVIS 12/22/191. Findings consistent with "gallstone ileus". A gallstone has eroded from the gallbladder into the proximal small bowel traveling distally and now OBSTRUCTS the distal small bowel. No pneumatosis or portal venous gas. No perforation.2. Gallbladder is collapsed with small amount of internal gas as well as pneumobilia consistent with cholecysto-enteric fistula 3. Small amount of high-density fluid in the posterior cul-de-sac presumably related to gallstone eroding through the gallbladder wall. 4. Recommend surgical consultation for the small bowel obstruction   Micro Data:  Blood cx x 2  Antimicrobials:  Cefepime 12/22 Flagyl 12/22   Interim history/subjective:   Now status post surgery.  Reports some abdominal surgical discomfort and itching.  Mildly hypotensive Objective   Blood pressure (Abnormal) 79/52, pulse 68, temperature 98.1 F (36.7 C), resp.  rate 17, height 5\' 3"  (1.6 m), weight 91.2 kg, SpO2 96 %. CVP:  [6 mmHg-13 mmHg] 13 mmHg      Intake/Output Summary (Last 24 hours) at 02/24/2018 1355 Last data filed at 02/24/2018 1300 Gross per 24 hour  Intake 7450.97 ml  Output 1625 ml  Net 5825.97 ml   Filed Weights   02/23/18 1109  Weight: 91.2 kg    Examination:  General: Pleasant 67 year old white female currently resting in bed she is in no acute distress HEENT normocephalic atraumatic no jugular venous distention mucous membranes are moist there is a left internal jugular vein triple-lumen catheter which is unremarkable Pulmonary: Diminished bases no accessory use Cardiac: Regular rate and rhythm sinus bradycardia on telemetry Abdomen: Soft, tender to palpation.  Midline lower abdominal dressing stained with blood, laparoscopic dressing intact right upper quadrant. Hypoactive bowel sounds GU: Clear yellow Extremities: Brisk capillary refill, slightly flushed skin.  Warm to palpation, strong pulses. Neuro: Awake oriented no focal deficits.   Resolved issues  Lactic acidosis  Assessment & Plan:   Septic Shock secondary to intra-abdominal source (  cholecysto-enteric fistula w/ gallstone ileus and abd translocation). Now s/p laparoscopic exploration w/ surgical findings of gallstone ileus w/ removal of 2 gallstones from distal ileus 12/23 -Hypotensive postoperatively, suspect postoperative Sirs response Plan: Continuing maintenance IV fluids Central venous pressure goal 8-10 Mean arterial blood pressure goal greater than 65 titrate norepinephrine as needed Following up CBC postoperatively Follow-up pending cultures Day #2 cefepime and Flagyl Will eventually need either MRCP or contrasted CT to better define biliary and duodenal anatomy & possible future surgical intervention to the gallbladder enteric fistula    Acute Kidney Injury ( BUN  45/ Cr 3.8) Baseline BUN 11 Cr 0.8- May 2015 Plan Ensure mean arterial  pressure greater than 65 Strict intake output Renal dose medications A.m. chemistry  ? UGIB w/ coffee ground emesis and melena  FOB positive  hgb stable Plan Trend cbc PPI BID   Mild thrombocytopenia Plan Trend cbc   Best practice:  Diet: NPO  Pain/Anxiety/Delirium protocol (if indicated): na  VAP protocol (if indicated): not indicated  DVT prophylaxis: heparin [place] and SCDs GI prophylaxis: PPI Glucose control: no h/o DM if BG exceeds 180mg /dl will start ISS Mobility: Bedrest Code Status: FULL Family Communication: daughter at bedside Disposition: ICU  Simonne MartinetPeter E Jadaya Sommerfield ACNP-BC [facility] Pager # [phone] OR # [phone] if no answer

## 2018-02-24 NOTE — Progress Notes (Signed)
CRITICAL VALUE ALERT  Critical Value:  BC+ in anaerobic bottle with gram neg rods   Date & Time Notied:  2244 02/24/18  Provider Notified: Dr. Vassie LollAlva  Orders Received/Actions taken: no new orders-pt already on maxipime

## 2018-02-24 NOTE — Progress Notes (Signed)
Initial Nutrition Assessment  DOCUMENTATION CODES:   Obesity unspecified  INTERVENTION:   RD will monitor for diet advancement vs the need for nutrition support.      NUTRITION DIAGNOSIS:   Inadequate oral intake related to acute illness(gallstone ileus s/p ex lap ) as evidenced by NPO status.  GOAL:   Patient will meet greater than or equal to 90% of their needs  MONITOR:   Diet advancement, Labs, Weight trends, Skin, I & O's  REASON FOR ASSESSMENT:   Malnutrition Screening Tool    ASSESSMENT:   67 yr old female with PMHx significant for Acid reflux, HTN, Nephrolithiasis, Osteopenia presented on 12/22 with nausea and vomiting. 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 Pt s/p ex lap with gallstone removal    Met with pt in room today. Pt reports good appetite and oral intake at baseline; reports her last normal meal was dinner Thursday 12/19. Pt reports developing nausea and vomiting after dinner on Thursday and her appetite and oral intake has steadily declined since. Pt reports the last food she ate was some crackers and broth on Saturday 12/21. Pt reports intentional weight loss over the past year. Pt has been following a low gluten, low carbohydrate, and high fat diet in a attempt to loose weight. Pt reports her UBW is ~245lbs; reports she has lost ~25lbs intentionally (down to 215lbs) but reports that she has lost ~14lbs since last Thursday. Pt with NGT in place with 239m output x 24 hrs. RD will monitor for diet advancement vs the need for nutrition support; recommend nutrition support if pt unable to initiate diet by 12/26. Pt is willing to drink Ensure supplements once her diet is advanced. Pt asking for gingerale and scrambled eggs today.   Medications reviewed and include: heparin, insulin, protonix, scopolamine, cefepime, LRS _0 /hr, metronidazole, levophed, vasopressin,  hydromorphone, phenergan   Labs reviewed: K 3.8 wnl, BUN 44(H), creat 2.60(H), Mg 2.1 wnl  UOP- 8575mx 24 hrs  I & O's- +6.4L  NUTRITION - FOCUSED PHYSICAL EXAM:    Most Recent Value  Orbital Region  No depletion  Upper Arm Region  No depletion  Thoracic and Lumbar Region  No depletion  Buccal Region  No depletion  Temple Region  No depletion  Clavicle Bone Region  No depletion  Clavicle and Acromion Bone Region  No depletion  Scapular Bone Region  No depletion  Dorsal Hand  No depletion  Patellar Region  No depletion  Anterior Thigh Region  No depletion  Posterior Calf Region  No depletion  Edema (RD Assessment)  Mild  Hair  Reviewed  Eyes  Reviewed  Mouth  Reviewed  Skin  Reviewed  Nails  Reviewed     Diet Order:   Diet Order            Diet NPO time specified  Diet effective now             EDUCATION NEEDS:   Education needs have been addressed  Skin:  Skin Assessment: Reviewed RN Assessment(incision abdomen )  Last BM:  12/22  Height:   Ht Readings from Last 1 Encounters:  02/23/18 _1  (1.6 m)    Weight:   Wt Readings from Last 1 Encounters:  02/23/18 91.2 kg    Ideal Body Weight:  52.3 kg  BMI:  Body mass index is 35.61 kg/m.  Estimated Nutritional Needs:   Kcal:  1800-2100kcal/day  Protein:  91-109g/day   Fluid:  >1.5L/day   Koleen Distance MS, RD, LDN Pager #- [phone] Office#- [phone] After Hours Pager: [phone]

## 2018-02-24 NOTE — Anesthesia Preprocedure Evaluation (Addendum)
Anesthesia Evaluation  Patient identified by MRN, date of birth, ID band Patient awake    Reviewed: Allergy & Precautions, NPO status , Patient's Chart, lab work & pertinent test results  Airway Mallampati: III  TM Distance: >3 FB Neck ROM: Full    Dental no notable dental hx. (+) Teeth Intact, Dental Advisory Given   Pulmonary neg pulmonary ROS,    Pulmonary exam normal breath sounds clear to auscultation       Cardiovascular hypertension, Normal cardiovascular exam Rhythm:Regular Rate:Normal  Echo 11/15  Left ventricle: The cavity size was normal. There was moderate focal basal hypertrophy of the septum. Systolic function was normal. The estimated ejection fraction was in the range of 50% to 55%. Wall motion was normal; there were no regional wall motion abnormalities. Doppler parameters are consistent with abnormal left ventricular relaxation (grade 1 diastolic dysfunction).   Neuro/Psych L upper arm numbness CVA, Residual Symptoms negative psych ROS   GI/Hepatic Neg liver ROS, GERD  Medicated,  Endo/Other  diabetes, Type 1  Renal/GU ARF and Renal InsufficiencyRenal disease     Musculoskeletal negative musculoskeletal ROS (+)   Abdominal (+) + obese,   Peds  Hematology negative hematology ROS (+)   Anesthesia Other Findings   Reproductive/Obstetrics                            Lab Results  Component Value Date   CREATININE 2.60 (H) 02/24/2018   BUN 44 (H) 02/24/2018   NA 137 02/24/2018   K 3.8 02/24/2018   CL 107 02/24/2018   CO2 23 02/24/2018    Lab Results  Component Value Date   WBC 9.7 02/24/2018   HGB 11.4 (L) 02/24/2018   HCT 35.5 (L) 02/24/2018   MCV 92.7 02/24/2018   PLT 145 (L) 02/24/2018    Anesthesia Physical Anesthesia Plan  ASA: IV  Anesthesia Plan: General   Post-op Pain Management:    Induction: Intravenous  PONV Risk Score and Plan: 4  or greater and Treatment may vary due to age or medical condition, Dexamethasone and Ondansetron  Airway Management Planned: Oral ETT  Additional Equipment:   Intra-op Plan:   Post-operative Plan: Extubation in OR  Informed Consent: I have reviewed the patients History and Physical, chart, labs and discussed the procedure including the risks, benefits and alternatives for the proposed anesthesia with the patient or authorized representative who has indicated his/her understanding and acceptance.   Dental advisory given  Plan Discussed with: CRNA  Anesthesia Plan Comments:        Anesthesia Quick Evaluation

## 2018-02-24 NOTE — Op Note (Signed)
02/23/2018 - 02/24/2018  10:36 AM  PATIENT:  Joanna ShutterJanet T Miklos  67 y.o. female  PRE-OPERATIVE DIAGNOSIS:  Gallstone ileus  POST-OPERATIVE DIAGNOSIS:  Gallstone ileus with 2 gallstones in the distal ileum  PROCEDURE:  Procedure(s): LAPAROSCOPIC EXPLORATION LAPAROTOMY WITH ENTEROTOMY AND REMOVAL OF 2 GALLSTONES FROM THE ILEUM  SURGEON:  Surgeon(s): Violeta Gelinashompson, Sereen Schaff, MD  ASSISTANTS: none   ANESTHESIA:   general  EBL:  Total I/O In: 224.2 [I.V.:129.6; IV Piggyback:94.5] Out: 400 [Urine:375; Blood:25]  BLOOD ADMINISTERED:none  DRAINS: none   SPECIMEN:  Excision  DISPOSITION OF SPECIMEN:  N/A  COUNTS:  YES  DICTATION: .Dragon Dictation Findings: 1 large gallstone & 1 medium sized gallstone in the distal ileum  Procedure in detail: [name] is brought to operating room for gallstone ileus.  Informed consent was obtained.  She was brought to the operating room and general endotracheal anesthesia was administered by the anesthesia staff.  Her abdomen was prepped and draped in sterile fashion.  She is on IV antibiotics by protocol.  We did a timeout procedure.  Infraumbilical region was infiltrated with local.  Infraumbilical incision was made.  Subcutaneous tissues were dissected down revealing the anterior fascia.  This was divided sharply along the midline.  Peritoneal cavity was entered under direct vision.  0 Vicryl pursestring was placed around the fascial opening.  Hassan trocar was inserted.  The abdomen was insufflated with carbon dioxide in standard fashion.  Laparoscopic expiration revealed a lot of dilated small bowel.  1 right-sided 5 mm port was placed under direct vision.  Exploration of the abdomen revealed the small bowel was very dilated and I had minimal space to work laparoscopically.  Decision was made to open.  I removed the trochars and deflated the abdomen.  I made a limited lower midline incision continuing from the StoutHassan port site.  I then gradually brought out the  distal dilated ileum and I see him encountered the large obstructing gallstone.  I also felt a additional gallstone a little bit proximal to it.  I placed an nonpenetrating bowel clamp proximally.  I made a small 1 cm longitudinal enterotomy in the ileum over the palpable large gallstone and it was removed easily.  I then removed the smaller gallstone through that enterotomy.  The gallstones were sent to pathology.  I then closed the enterotomy in a transverse fashion with multiple interrupted 2-0 silk sutures.  I then milked enteric contents after removing the proximal bowel clamp.  Enteric contents passed easily past the enterotomy closure and there was no leakage.  I returned the bowel to the anatomic position in the abdomen.  The abdomen was copiously irrigated with saline.  I closed the fascia with running #1 looped PDS.  Subcutaneous tissues were irrigated and the skin was closed with staples.  All counts were correct.  She tolerated procedure well without apparent complication.  She was taken recovery room in stable condition. PATIENT DISPOSITION:  PACU - hemodynamically stable.   Delay start of Pharmacological VTE agent (>24hrs) due to surgical blood loss or risk of bleeding:  no  Violeta GelinasBurke Shin Lamour, MD, MPH, FACS Pager: [phone]  12/23/201910:36 AM

## 2018-02-24 NOTE — Progress Notes (Signed)
eLink Physician-Brief Progress Note Patient Name: Marin ShutterJanet T Bastin DOB: 12/01/1950 MRN: [id number]   Date of Service  02/24/2018  HPI/Events of Note  Urine looks cloudy - UA with few bacteria, 6-10 WBC, small leukocyte and negative nitrite. Nurse concerned about urinary tract infection. Patient is already on Cefepime and Flagyl. Cefepime should cover the urine well.   eICU Interventions  Will order: 1. Urine culture now.      Intervention Category Major Interventions: Infection - evaluation and management  Telecia Larocque Eugene 02/24/2018, 4:36 AM

## 2018-02-24 NOTE — Progress Notes (Signed)
Subjective/Chief Complaint: No new complaints, feels a little better with NGT   Objective: Vital signs in last 24 hours: Temp:  [97.6 F (36.4 C)-99.5 F (37.5 C)] 98.2 F (36.8 C) (12/23 0420) Pulse Rate:  [33-145] 77 (12/23 0630) Resp:  [11-33] 20 (12/23 0630) BP: (47-121)/(32-89) 90/42 (12/23 0630) SpO2:  [84 %-100 %] 95 % (12/23 0630) Arterial Line BP: (61-129)/(46-74) 107/51 (12/23 0630) Weight:  [91.2 kg] 91.2 kg (12/22 1109) Last BM Date: 02/23/18  Intake/Output from previous day: 12/22 0701 - 12/23 0700 In: 6869.1 [I.V.:2665; IV Piggyback:4204.1] Out: 950 [Urine:750; Emesis/NG output:200] Intake/Output this shift: No intake/output data recorded.  General appearance: cooperative Resp: clear to auscultation bilaterally Cardio: regular rate and rhythm GI: distended but soft, min tenderness  Lab Results:  Recent Labs    02/23/18 2022 02/24/18 0317  WBC 12.6* 9.7  HGB 12.4 11.4*  HCT 38.0 35.5*  PLT 173 145*   BMET Recent Labs    02/23/18 2022 02/24/18 0317  NA 136 137  K 3.0* 3.8  CL 102 107  CO2 22 23  GLUCOSE 169* 190*  BUN 50* 44*  CREATININE 3.42* 2.60*  CALCIUM 7.9* 8.1*   PT/INR Recent Labs    02/23/18 1251  LABPROT 15.4*  INR 1.23   ABG Recent Labs    02/23/18 1440  HCO3 24.4    Studies/Results: Ct Abdomen Pelvis Wo Contrast  Result Date: 02/23/2018 CLINICAL DATA:  Abdominal distension. Coffee-ground emesis. Concern for MS enteric ischemia ruptured aneurysm or perforation. Delayed stool. EXAM: CT ABDOMEN AND PELVIS WITHOUT CONTRAST TECHNIQUE: Multidetector CT imaging of the abdomen and pelvis was performed following the standard protocol without IV contrast. COMPARISON:  None FINDINGS: Lower chest: Lung bases are clear. Hepatobiliary: Small amount pneumobilia in the nondependent LEFT hepatic lobe. Gas is present within the common hepatic duct/common bile duct. There is a poorly defined gallbladder. There is gas within the  expected location of the gallbladder fossa and enhancing tissue (image 26/2). This is favored to represent a collapsed gallbladder with intraluminal gas. No significant free fluid.  No intraperitoneal free air. Pancreas: Pancreas is normal. No ductal dilatation. No pancreatic inflammation. Spleen: Normal spleen Adrenals/urinary tract: Adrenal glands and kidneys are normal. The ureters and bladder normal. Stomach/Bowel: The stomach and duodenum are normal. There is dilatation of the proximal small bowel up to 4 cm best seen on the CT topogram. The dilated small bowel is fluid and gas filled and extends over a long segment of small bowel into the RIGHT lower quadrant. Within loop of bowel in the RIGHT lower quadrant there is a round 2.2 cm lesion with peripheral calcification in central low-attenuation. This is favored a gallstone which has eroded into the bowel and migrated distally. Proximal to this presumed gallstone the small bowel is obstructed. No intraperitoneal free air. Vascular/Lymphatic: Abdominal aorta is normal caliber. No periportal or retroperitoneal adenopathy. No pelvic adenopathy. Reproductive: Uterus normal Other: Small amount high-density free fluid in the posterior cul-de-sac. Musculoskeletal: No aggressive osseous lesion. IMPRESSION: 1. Findings consistent with "gallstone ileus". A gallstone has eroded from the gallbladder into the proximal small bowel traveling distally and now OBSTRUCTS the distal small bowel. No pneumatosis or portal venous gas. No perforation. 2. Gallbladder is collapsed with small amount of internal gas as well as pneumobilia consistent with cholecysto-enteric fistula 3. Small amount of high-density fluid in the posterior cul-de-sac presumably related to gallstone eroding through the gallbladder wall. 4. Recommend surgical consultation for the small bowel obstruction. Findings conveyed  toANKIT NANAVATI on 02/23/2018  at13:04. Electronically Signed   By: Genevive Bi M.D.    On: 02/23/2018 13:10   Dg Chest Portable 1 View  Result Date: 02/23/2018 CLINICAL DATA:  Nasogastric tube placement EXAM: PORTABLE CHEST 1 VIEW COMPARISON:  Portable exam at 1550 hrs compared to earlier study of 02/23/2018 FINDINGS: Nasogastric tube extends into stomach. Enlargement of cardiac silhouette. Mediastinal contour stable. LEFT jugular line tip projects over SVC. Slight chronic accentuation of pulmonary markings in the mid to lower lungs. No segmental consolidation, pleural effusion or pneumothorax. IMPRESSION: Nasogastric tube extends into stomach. Otherwise no change. Electronically Signed   By: Ulyses Southward M.D.   On: 02/23/2018 16:07   Dg Chest Portable 1 View  Result Date: 02/23/2018 CLINICAL DATA:  Central line placement EXAM: PORTABLE CHEST 1 VIEW COMPARISON:  02/23/2018 FINDINGS: Left jugular central venous catheter tip in the SVC. No pneumothorax. Heart size upper normal. Pulmonary vascular congestion unchanged. Negative for effusion Right paratracheal soft tissue thickening could represent venous engorgement or adenopathy. Follow-up two-view chest x-ray suggested. IMPRESSION: Central venous catheter tip in the SVC.  No pneumothorax Mild vascular congestion Right paratracheal soft tissue thickening could represent adenopathy. Recommend two-view chest x-ray. Electronically Signed   By: Marlan Palau M.D.   On: 02/23/2018 14:44   Dg Chest Portable 1 View  Result Date: 02/23/2018 CLINICAL DATA:  Coffee ground emesis 3 days. EXAM: PORTABLE CHEST 1 VIEW COMPARISON:  None. FINDINGS: Lungs are hypoinflated without focal airspace consolidation or effusion. There is mild hazy prominence of the central pulmonary vessels likely mild vascular congestion. Cardiomediastinal silhouette and remainder of the exam is unremarkable. IMPRESSION: Suggestion of mild vascular congestion. Electronically Signed   By: Elberta Fortis M.D.   On: 02/23/2018 12:58   Dg Abd Portable 1v  Result Date:  02/24/2018 CLINICAL DATA:  Gallstone ileus EXAM: PORTABLE ABDOMEN - 1 VIEW COMPARISON:  CT of the abdomen and pelvis 02/23/2018 FINDINGS: NG tube is in the stomach. The stomach is decompressed. Multiple loops of small bowel remain dilated. There is no free air. Degenerative changes are noted in the spine and hips. IMPRESSION: 1. Persistent dilation of small bowel. 2. Decompression of the stomach following placement of NG tube. Electronically Signed   By: Marin Roberts M.D.   On: 02/24/2018 06:41    Anti-infectives: Anti-infectives (From admission, onward)   Start     Dose/Rate Route Frequency Ordered Stop   02/24/18 1400  ceFEPIme (MAXIPIME) 1 g in sodium chloride 0.9 % 100 mL IVPB     1 g 200 mL/hr over 30 Minutes Intravenous Every 24 hours 02/23/18 2022     02/24/18 0000  metroNIDAZOLE (FLAGYL) IVPB 500 mg    Note to Pharmacy:  cholecysto-enteric fistula and SBO  In septic shock   500 mg 100 mL/hr over 60 Minutes Intravenous Every 8 hours 02/23/18 2003     02/23/18 1300  ceFEPIme (MAXIPIME) 1 g in sodium chloride 0.9 % 100 mL IVPB     1 g 200 mL/hr over 30 Minutes Intravenous  Once 02/23/18 1257 02/23/18 1454   02/23/18 1300  metroNIDAZOLE (FLAGYL) IVPB 500 mg     500 mg 100 mL/hr over 60 Minutes Intravenous  Once 02/23/18 1257 02/23/18 1705      Assessment/Plan: Gallstone ileus - to OR for laparoscopic assisted small bowel resection. I discussed the procedure, risks, benefits, and expected post-op course with her and her daughter. She agrees.  LOS: 1 day    Gabrielle Dare  Janee Mornhompson 02/24/2018

## 2018-02-24 NOTE — Anesthesia Postprocedure Evaluation (Signed)
Anesthesia Post Note  Patient: Marin ShutterJanet T Purdy  Procedure(s) Performed: LAPAROSCOPIC ASSISTED/SMALL BOWEL RESECTION convereted to open  (N/A )     Patient location during evaluation: PACU Anesthesia Type: General Level of consciousness: awake and alert Pain management: pain level controlled Vital Signs Assessment: post-procedure vital signs reviewed and stable Respiratory status: spontaneous breathing, nonlabored ventilation, respiratory function stable and patient connected to nasal cannula oxygen Cardiovascular status: blood pressure returned to baseline and stable Postop Assessment: no apparent nausea or vomiting Anesthetic complications: no    Last Vitals:  Vitals:   02/24/18 1209 02/24/18 1300  BP: (!) 87/48 (!) 79/52  Pulse: 74 68  Resp: 13 17  Temp:    SpO2: 94% 96%    Last Pain:  Vitals:   02/24/18 1229  TempSrc:   PainSc: 2                  Trevor IhaStephen A Callen Vancuren

## 2018-02-25 ENCOUNTER — Encounter (HOSPITAL_COMMUNITY): Payer: Self-pay | Admitting: General Surgery

## 2018-02-25 LAB — CBC WITH DIFFERENTIAL/PLATELET
ABS IMMATURE GRANULOCYTES: 0.04 10*3/uL (ref 0.00–0.07)
Basophils Absolute: 0 10*3/uL (ref 0.0–0.1)
Basophils Relative: 0 %
Eosinophils Absolute: 0 10*3/uL (ref 0.0–0.5)
Eosinophils Relative: 0 %
HEMATOCRIT: 33.1 % — AB (ref 36.0–46.0)
HEMOGLOBIN: 10.7 g/dL — AB (ref 12.0–15.0)
Immature Granulocytes: 1 %
LYMPHS ABS: 0.8 10*3/uL (ref 0.7–4.0)
LYMPHS PCT: 12 %
MCH: 31 pg (ref 26.0–34.0)
MCHC: 32.3 g/dL (ref 30.0–36.0)
MCV: 95.9 fL (ref 80.0–100.0)
Monocytes Absolute: 0.3 10*3/uL (ref 0.1–1.0)
Monocytes Relative: 4 %
NEUTROS ABS: 5.3 10*3/uL (ref 1.7–7.7)
Neutrophils Relative %: 83 %
Platelets: 122 10*3/uL — ABNORMAL LOW (ref 150–400)
RBC: 3.45 MIL/uL — ABNORMAL LOW (ref 3.87–5.11)
RDW: 15.9 % — ABNORMAL HIGH (ref 11.5–15.5)
WBC: 6.4 10*3/uL (ref 4.0–10.5)
nRBC: 0 % (ref 0.0–0.2)

## 2018-02-25 LAB — URINE CULTURE: CULTURE: NO GROWTH

## 2018-02-25 LAB — COMPREHENSIVE METABOLIC PANEL
ALT: 25 U/L (ref 0–44)
AST: 20 U/L (ref 15–41)
Albumin: 2.2 g/dL — ABNORMAL LOW (ref 3.5–5.0)
Alkaline Phosphatase: 36 U/L — ABNORMAL LOW (ref 38–126)
Anion gap: 9 (ref 5–15)
BUN: 29 mg/dL — ABNORMAL HIGH (ref 8–23)
CO2: 22 mmol/L (ref 22–32)
Calcium: 8 mg/dL — ABNORMAL LOW (ref 8.9–10.3)
Chloride: 113 mmol/L — ABNORMAL HIGH (ref 98–111)
Creatinine, Ser: 1.3 mg/dL — ABNORMAL HIGH (ref 0.44–1.00)
GFR calc Af Amer: 49 mL/min — ABNORMAL LOW (ref >60)
GFR calc non Af Amer: 42 mL/min — ABNORMAL LOW (ref >60)
Glucose, Bld: 111 mg/dL — ABNORMAL HIGH (ref 70–99)
Potassium: 3.6 mmol/L (ref 3.5–5.1)
Sodium: 144 mmol/L (ref 135–145)
Total Bilirubin: 0.6 mg/dL (ref 0.3–1.2)
Total Protein: 5.1 g/dL — ABNORMAL LOW (ref 6.5–8.1)

## 2018-02-25 LAB — BLOOD CULTURE ID PANEL (REFLEXED)
Acinetobacter baumannii: NOT DETECTED
CANDIDA PARAPSILOSIS: NOT DETECTED
Candida albicans: NOT DETECTED
Candida glabrata: NOT DETECTED
Candida krusei: NOT DETECTED
Candida tropicalis: NOT DETECTED
Carbapenem resistance: NOT DETECTED
ESCHERICHIA COLI: NOT DETECTED
Enterobacter cloacae complex: NOT DETECTED
Enterobacteriaceae species: DETECTED — AB
Enterococcus species: NOT DETECTED
Haemophilus influenzae: NOT DETECTED
Klebsiella oxytoca: NOT DETECTED
Klebsiella pneumoniae: DETECTED — AB
Listeria monocytogenes: NOT DETECTED
Neisseria meningitidis: NOT DETECTED
PSEUDOMONAS AERUGINOSA: NOT DETECTED
Proteus species: NOT DETECTED
Serratia marcescens: NOT DETECTED
Staphylococcus aureus (BCID): NOT DETECTED
Staphylococcus species: NOT DETECTED
Streptococcus agalactiae: NOT DETECTED
Streptococcus pneumoniae: NOT DETECTED
Streptococcus pyogenes: NOT DETECTED
Streptococcus species: NOT DETECTED

## 2018-02-25 LAB — GLUCOSE, CAPILLARY
Glucose-Capillary: 103 mg/dL — ABNORMAL HIGH (ref 70–99)
Glucose-Capillary: 88 mg/dL (ref 70–99)
Glucose-Capillary: 84 mg/dL (ref 70–99)
Glucose-Capillary: 71 mg/dL (ref 70–99)
Glucose-Capillary: 77 mg/dL (ref 70–99)

## 2018-02-25 LAB — TROPONIN I: Troponin I: <0.03 ng/mL (ref <0.03)

## 2018-02-25 MED ORDER — POLYVINYL ALCOHOL 1.4 % OP SOLN
1.0000 [drp] | OPHTHALMIC | Status: DC | PRN
  Filled 2018-02-25: qty 15

## 2018-02-25 MED ORDER — HYDROCORTISONE NA SUCCINATE PF 100 MG IJ SOLR
50.0000 mg | Freq: Every day | INTRAMUSCULAR | Status: AC
  Administered 2018-02-26: 50 mg via INTRAVENOUS
  Filled 2018-02-25: qty 2

## 2018-02-25 MED ORDER — SODIUM CHLORIDE 0.9 % IV SOLN
1.0000 g | Freq: Two times a day (BID) | INTRAVENOUS | Status: DC
  Administered 2018-02-25 – 2018-02-27 (×5): 1 g via INTRAVENOUS
  Filled 2018-02-25 (×5): qty 1

## 2018-02-25 MED ORDER — HYDROCORTISONE NA SUCCINATE PF 100 MG IJ SOLR
50.0000 mg | Freq: Two times a day (BID) | INTRAMUSCULAR | Status: AC
  Administered 2018-02-25 – 2018-02-26 (×2): 50 mg via INTRAVENOUS
  Filled 2018-02-25 (×2): qty 2

## 2018-02-25 MED FILL — Phenylephrine HCl IV Soln 10 MG/ML: INTRAVENOUS | Qty: 10 | Status: AC

## 2018-02-25 NOTE — Progress Notes (Signed)
PHARMACY - PHYSICIAN COMMUNICATION CRITICAL VALUE ALERT - BLOOD CULTURE IDENTIFICATION (BCID)  Joanna ShutterJanet T Starliper is an 67 y.o. female  with septic shock and gallstone ileus s/p laparatomy 12/23  Assessment:  Blood culture growing Klebsiella pneumoniae  Name of physician (or Provider) Contacted: Dr. Vassie LollAlva  Current antibiotics: Cefepime and Flagyl  Changes to prescribed antibiotics recommended:    Continue current antibiotics for now.  Consider narrowing to Rocephin 2 g IV q24h when appropriate  Results for orders placed or performed during the hospital encounter of 02/23/18  Blood Culture ID Panel (Reflexed) (Collected: 02/23/2018 12:56 PM)  Result Value Ref Range   Enterococcus species NOT DETECTED NOT DETECTED   Listeria monocytogenes NOT DETECTED NOT DETECTED   Staphylococcus species NOT DETECTED NOT DETECTED   Staphylococcus aureus (BCID) NOT DETECTED NOT DETECTED   Streptococcus species NOT DETECTED NOT DETECTED   Streptococcus agalactiae NOT DETECTED NOT DETECTED   Streptococcus pneumoniae NOT DETECTED NOT DETECTED   Streptococcus pyogenes NOT DETECTED NOT DETECTED   Acinetobacter baumannii NOT DETECTED NOT DETECTED   Enterobacteriaceae species DETECTED (A) NOT DETECTED   Enterobacter cloacae complex NOT DETECTED NOT DETECTED   Escherichia coli NOT DETECTED NOT DETECTED   Klebsiella oxytoca NOT DETECTED NOT DETECTED   Klebsiella pneumoniae DETECTED (A) NOT DETECTED   Proteus species NOT DETECTED NOT DETECTED   Serratia marcescens NOT DETECTED NOT DETECTED   Carbapenem resistance NOT DETECTED NOT DETECTED   Haemophilus influenzae NOT DETECTED NOT DETECTED   Neisseria meningitidis NOT DETECTED NOT DETECTED   Pseudomonas aeruginosa NOT DETECTED NOT DETECTED   Candida albicans NOT DETECTED NOT DETECTED   Candida glabrata NOT DETECTED NOT DETECTED   Candida krusei NOT DETECTED NOT DETECTED   Candida parapsilosis NOT DETECTED NOT DETECTED   Candida tropicalis NOT DETECTED NOT  DETECTED    Eddie Candlebbott, Reghan Thul Vernon 02/25/2018  4:59 AM

## 2018-02-25 NOTE — Progress Notes (Addendum)
..   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   Hospital course   12/22 admitted, sepsis/shock.  Antibiotics started, IV hydration initiated surgical consult requested 12/23 To OR  laparoscopic exploration w/ surgical findings of gallstone ileus w/ removal of 2 gallstones from distal ileus return to the ICU hypotensive pressors resumed 12/24 Off pressors  Past Medical History  Acid reflux, arthritis, cystocele, HTN, renal stones.   Consults:  Surgery - 02/23/18 Dr Magnus IvanBlackman [facility]- 02/23/18  Procedures:  NGT placed at [facility]- 12/22 [facility] CVC at [facility]- 12/22 Femoral A line 12/22 >   Significant Diagnostic Tests:  CT ABDOMEN/PELVIS 12/22/191. Findings consistent with "gallstone ileus". A gallstone has eroded from the gallbladder into the proximal small bowel traveling distally and now OBSTRUCTS the distal small bowel. No pneumatosis or portal venous gas. No perforation.2. Gallbladder is collapsed with small amount of internal gas as well as pneumobilia consistent with cholecysto-enteric fistula 3. Small amount of high-density fluid in the posterior cul-de-sac presumably related to gallstone eroding through the gallbladder wall. 4. Recommend surgical consultation for the small bowel obstruction  Micro Data:  Blood cx x 2- Klebsiella  Antimicrobials:  Cefepime 12/22 >> Flagyl 12/22 -12/24  Interim history/subjective:  Stable overnight Off pressors.  Itching is improved Complains of mild abdominal pain.  Objective   Blood pressure 116/64, pulse 60,  temperature 98.4 F (36.9 C), temperature source Oral, resp. rate 20, height 5\' 3"  (1.6 m), weight 106.2 kg, SpO2 98 %. CVP:  [13 mmHg-16 mmHg] 15 mmHg      Intake/Output Summary (Last 24 hours) at 02/25/2018 0817 Last data filed at 02/25/2018 0600 Gross per 24 hour  Intake 2642.55 ml  Output 1885 ml  Net 757.55 ml   Filed Weights   02/23/18 1109 02/25/18 0600  Weight: 91.2 kg 106.2 kg    Examination: Gen:      No acute distress HEENT:  EOMI, sclera anicteric Neck:     No masses; no thyromegaly Lungs:    Clear to auscultation bilaterally; normal respiratory effort CV:         Regular rate and rhythm; no murmurs Abd:   Midline lower abdominal dressings, no guarding or rigidity. Ext:    No edema; adequate peripheral perfusion Skin:      Warm and dry; no rash Neuro: alert and oriented x 3 Psych: normal mood and affect  Resolved issues  Lactic acidosis, septic shock   Assessment & Plan:   Septic Shock secondary to intra-abdominal source (  cholecysto-enteric fistula w/ gallstone ileus and abd translocation). Now s/p laparoscopic exploration w/ surgical findings of gallstone ileus w/ removal of 2 gallstones from distal ileus 12/23 Off pressors Continue LR at 25 an hour Keep n.p.o. for now until cleared by surgery Wean down stress dose steroids to 50 mg every 12.  Can stop tomorrow Remove  A-line and central line.   Klebsiella bacteremia Suspect translocation Stop Flagyl. Narrow from cefepime once final sensitivities are back  Acute kidney injury Improving Monitor urine output and creatinine  ? UGIB  FOB positive  hgb stable Plan Evaluated by GI.  No evidence of active bleed Continue PPI twice daily.  Mild thrombocytopenia Plan Trend cbc  Transient atrial fibrillation Continues in sinus rhythm Telemetry monitoring.  Peripheral eosinophilia, rash Possible drug reaction to dilaudid Has resolved today Fentanyl PRN for pain Monitor CBC.  Best practice:    Diet: NPO  Pain/Anxiety/Delirium protocol (if indicated): na  VAP protocol (if indicated): not indicated  DVT prophylaxis: heparin [place] and SCDs GI prophylaxis: PPI Glucose control: no h/o DM if BG exceeds 180mg /dl will start ISS Mobility: OOB Code Status: FULL Family Communication: Patient and daughter updated at bedside. Disposition: transfer to med surg and TRH  Chilton GreathousePraveen Areal Cochrane MD [facility] 02/25/2018, 8:31 AM

## 2018-02-25 NOTE — Progress Notes (Signed)
Chaplain responded to spiritual care consult. Patient may be interested in AD.  Pt was sitting upright in bed un-wrapping gift when chaplain entered room.  Pt's two friends were bedside, celebrating Christmas with her.  Chaplain offered AD document and said she would come back at more convenient time if requested.   Lynnell ChadVirginia Chesnee Floren Pager [phone]

## 2018-02-25 NOTE — Progress Notes (Signed)
Pharmacy Antibiotic Note  Joanna Good is a 67 y.o. female admitted on 02/23/2018 with SBO and fistula.  Pharmacy has been consulted for Cefepime dosing. Patient is s/p surgery yesterday and now growing 1/4 klebsiella pneumonia in blood cultures. WBC has normalized. Afebrile. SCr has improved significantly.   Plan: Increase Cefepime to 1 gm IV Q 12 hours  Stop flagyl per MD  De-escalate to ceftriaxone based on sensitivities   Height: 5\' 3"  (160 cm) Weight: 234 lb 2.1 oz (106.2 kg) IBW/kg (Calculated) : 52.4  Temp (24hrs), Avg:98.3 F (36.8 C), Min:97.8 F (36.6 C), Max:98.8 F (37.1 C)  Recent Labs  Lab 02/23/18 1158 02/23/18 1253 02/23/18 1257 02/23/18 1458 02/23/18 2022 02/24/18 0317 02/24/18 0350 02/24/18 1503 02/25/18 0451  WBC  --  15.8*  --   --  12.6* 9.7  --  6.8 6.4  CREATININE  --  3.73* 3.80*  --  3.42* 2.60*  --   --  1.30*  LATICACIDVEN 5.69* 4.2*  --  2.08*  --   --  1.1  --   --     Estimated Creatinine Clearance: 49 mL/min (A) (by C-G formula based on SCr of 1.3 mg/dL (H)).    Allergies  Allergen Reactions  . Bee Venom   . Claritin [Loratadine] Nausea And Vomiting  . Ibuprofen   . Sulfonamide Derivatives     12/22 Cefepime >>  12/22 Flagyl >> 12/24  12/22 BCx: 1/4 Klebsiella pna (on BCID)  12/22 MRSA PCR: neg  12/23 UCx > neg   Thank you for allowing pharmacy to be a part of this patient's care.  Vinnie LevelBenjamin Daniel Johndrow, PharmD., BCPS Clinical Pharmacist Clinical phone for 02/25/18 until 3:30pm: [phone] If after 3:30pm, [facility] for unit-specific pharmacist

## 2018-02-25 NOTE — Progress Notes (Signed)
1 Day Post-Op   Subjective/Chief Complaint: Comfortable Pain controlled Off pressors Has passed a little flatus   Objective: Vital signs in last 24 hours: Temp:  [97.8 F (36.6 C)-98.8 F (37.1 C)] 98.4 F (36.9 C) (12/24 0700) Pulse Rate:  [51-74] 60 (12/24 0800) Resp:  [12-21] 20 (12/24 0800) BP: (76-123)/(48-82) 116/64 (12/24 0800) SpO2:  [88 %-100 %] 98 % (12/24 0800) Arterial Line BP: (85-152)/(40-73) 142/67 (12/24 0800) Weight:  [106.2 kg] 106.2 kg (12/24 0600) Last BM Date: 02/23/18  Intake/Output from previous day: 12/23 0701 - 12/24 0700 In: 2802.1 [I.V.:2407.7; IV Piggyback:394.5] Out: 2010 [Urine:1710; Emesis/NG output:100; Blood:50] Intake/Output this shift: No intake/output data recorded.  Exam: Awake and alert Abdomen soft, dressing dry  Lab Results:  Recent Labs    02/24/18 1503 02/25/18 0451  WBC 6.8 6.4  HGB 10.8* 10.7*  HCT 34.4* 33.1*  PLT 124* 122*   BMET Recent Labs    02/24/18 0317 02/25/18 0451  NA 137 144  K 3.8 3.6  CL 107 113*  CO2 23 22  GLUCOSE 190* 111*  BUN 44* 29*  CREATININE 2.60* 1.30*  CALCIUM 8.1* 8.0*   PT/INR Recent Labs    02/23/18 1251  LABPROT 15.4*  INR 1.23   ABG Recent Labs    02/23/18 1440  HCO3 24.4    Studies/Results: Ct Abdomen Pelvis Wo Contrast  Result Date: 02/23/2018 CLINICAL DATA:  Abdominal distension. Coffee-ground emesis. Concern for MS enteric ischemia ruptured aneurysm or perforation. Delayed stool. EXAM: CT ABDOMEN AND PELVIS WITHOUT CONTRAST TECHNIQUE: Multidetector CT imaging of the abdomen and pelvis was performed following the standard protocol without IV contrast. COMPARISON:  None FINDINGS: Lower chest: Lung bases are clear. Hepatobiliary: Small amount pneumobilia in the nondependent LEFT hepatic lobe. Gas is present within the common hepatic duct/common bile duct. There is a poorly defined gallbladder. There is gas within the expected location of the gallbladder fossa and  enhancing tissue (image 26/2). This is favored to represent a collapsed gallbladder with intraluminal gas. No significant free fluid.  No intraperitoneal free air. Pancreas: Pancreas is normal. No ductal dilatation. No pancreatic inflammation. Spleen: Normal spleen Adrenals/urinary tract: Adrenal glands and kidneys are normal. The ureters and bladder normal. Stomach/Bowel: The stomach and duodenum are normal. There is dilatation of the proximal small bowel up to 4 cm best seen on the CT topogram. The dilated small bowel is fluid and gas filled and extends over a long segment of small bowel into the RIGHT lower quadrant. Within loop of bowel in the RIGHT lower quadrant there is a round 2.2 cm lesion with peripheral calcification in central low-attenuation. This is favored a gallstone which has eroded into the bowel and migrated distally. Proximal to this presumed gallstone the small bowel is obstructed. No intraperitoneal free air. Vascular/Lymphatic: Abdominal aorta is normal caliber. No periportal or retroperitoneal adenopathy. No pelvic adenopathy. Reproductive: Uterus normal Other: Small amount high-density free fluid in the posterior cul-de-sac. Musculoskeletal: No aggressive osseous lesion. IMPRESSION: 1. Findings consistent with "gallstone ileus". A gallstone has eroded from the gallbladder into the proximal small bowel traveling distally and now OBSTRUCTS the distal small bowel. No pneumatosis or portal venous gas. No perforation. 2. Gallbladder is collapsed with small amount of internal gas as well as pneumobilia consistent with cholecysto-enteric fistula 3. Small amount of high-density fluid in the posterior cul-de-sac presumably related to gallstone eroding through the gallbladder wall. 4. Recommend surgical consultation for the small bowel obstruction. Findings conveyed toANKIT NANAVATI on 02/23/2018  [id number]. Electronically Signed   By: Genevive BiStewart  Edmunds M.D.   On: 02/23/2018 13:10   Dg Chest Portable 1  View  Result Date: 02/23/2018 CLINICAL DATA:  Nasogastric tube placement EXAM: PORTABLE CHEST 1 VIEW COMPARISON:  Portable exam at 1550 hrs compared to earlier study of 02/23/2018 FINDINGS: Nasogastric tube extends into stomach. Enlargement of cardiac silhouette. Mediastinal contour stable. LEFT jugular line tip projects over SVC. Slight chronic accentuation of pulmonary markings in the mid to lower lungs. No segmental consolidation, pleural effusion or pneumothorax. IMPRESSION: Nasogastric tube extends into stomach. Otherwise no change. Electronically Signed   By: Ulyses SouthwardMark  Boles M.D.   On: 02/23/2018 16:07   Dg Chest Portable 1 View  Result Date: 02/23/2018 CLINICAL DATA:  Central line placement EXAM: PORTABLE CHEST 1 VIEW COMPARISON:  02/23/2018 FINDINGS: Left jugular central venous catheter tip in the SVC. No pneumothorax. Heart size upper normal. Pulmonary vascular congestion unchanged. Negative for effusion Right paratracheal soft tissue thickening could represent venous engorgement or adenopathy. Follow-up two-view chest x-ray suggested. IMPRESSION: Central venous catheter tip in the SVC.  No pneumothorax Mild vascular congestion Right paratracheal soft tissue thickening could represent adenopathy. Recommend two-view chest x-ray. Electronically Signed   By: Marlan Palauharles  Clark M.D.   On: 02/23/2018 14:44   Dg Chest Portable 1 View  Result Date: 02/23/2018 CLINICAL DATA:  Coffee ground emesis 3 days. EXAM: PORTABLE CHEST 1 VIEW COMPARISON:  None. FINDINGS: Lungs are hypoinflated without focal airspace consolidation or effusion. There is mild hazy prominence of the central pulmonary vessels likely mild vascular congestion. Cardiomediastinal silhouette and remainder of the exam is unremarkable. IMPRESSION: Suggestion of mild vascular congestion. Electronically Signed   By: Elberta Fortisaniel  Boyle M.D.   On: 02/23/2018 12:58   Dg Abd Portable 1v  Result Date: 02/24/2018 CLINICAL DATA:  Gallstone ileus EXAM:  PORTABLE ABDOMEN - 1 VIEW COMPARISON:  CT of the abdomen and pelvis 02/23/2018 FINDINGS: NG tube is in the stomach. The stomach is decompressed. Multiple loops of small bowel remain dilated. There is no free air. Degenerative changes are noted in the spine and hips. IMPRESSION: 1. Persistent dilation of small bowel. 2. Decompression of the stomach following placement of NG tube. Electronically Signed   By: Marin Robertshristopher  Mattern M.D.   On: 02/24/2018 06:41    Anti-infectives: Anti-infectives (From admission, onward)   Start     Dose/Rate Route Frequency Ordered Stop   02/25/18 1000  ceFEPIme (MAXIPIME) 1 g in sodium chloride 0.9 % 100 mL IVPB     1 g 200 mL/hr over 30 Minutes Intravenous Every 12 hours 02/25/18 0820     02/24/18 1400  ceFEPIme (MAXIPIME) 1 g in sodium chloride 0.9 % 100 mL IVPB  Status:  Discontinued     1 g 200 mL/hr over 30 Minutes Intravenous Every 24 hours 02/23/18 2022 02/25/18 0820   02/24/18 0000  metroNIDAZOLE (FLAGYL) IVPB 500 mg  Status:  Discontinued    Note to Pharmacy:  cholecysto-enteric fistula and SBO  In septic shock   500 mg 100 mL/hr over 60 Minutes Intravenous Every 8 hours 02/23/18 2003 02/25/18 0822   02/23/18 1300  ceFEPIme (MAXIPIME) 1 g in sodium chloride 0.9 % 100 mL IVPB     1 g 200 mL/hr over 30 Minutes Intravenous  Once 02/23/18 1257 02/23/18 1454   02/23/18 1300  metroNIDAZOLE (FLAGYL) IVPB 500 mg     500 mg 100 mL/hr over 60 Minutes Intravenous  Once 02/23/18 1257 02/23/18 1705  Assessment/Plan: s/p Procedure(s): LAPAROSCOPIC ASSISTED/SMALL BOWEL RESECTION convereted to open  (N/A)  Gallstone ileus s/p exp lap, stone removal through enterotomy  Continue NPO, NG, antibiotics Hopefully will remove NG tomorrow Ok to get out of bed, ambulate Ok to move out of unit from surgery standpoint  LOS: 2 days    Abigail Miyamoto 02/25/2018

## 2018-02-26 LAB — CBC WITH DIFFERENTIAL/PLATELET
ABS IMMATURE GRANULOCYTES: 0.05 10*3/uL (ref 0.00–0.07)
Basophils Absolute: 0 10*3/uL (ref 0.0–0.1)
Basophils Relative: 0 %
Eosinophils Absolute: 0.1 10*3/uL (ref 0.0–0.5)
Eosinophils Relative: 1 %
HCT: 33 % — ABNORMAL LOW (ref 36.0–46.0)
HEMOGLOBIN: 10.7 g/dL — AB (ref 12.0–15.0)
Immature Granulocytes: 1 %
LYMPHS ABS: 1.3 10*3/uL (ref 0.7–4.0)
Lymphocytes Relative: 21 %
MCH: 30.9 pg (ref 26.0–34.0)
MCHC: 32.4 g/dL (ref 30.0–36.0)
MCV: 95.4 fL (ref 80.0–100.0)
Monocytes Absolute: 0.5 10*3/uL (ref 0.1–1.0)
Monocytes Relative: 7 %
Neutro Abs: 4.3 10*3/uL (ref 1.7–7.7)
Neutrophils Relative %: 70 %
Platelets: 127 10*3/uL — ABNORMAL LOW (ref 150–400)
RBC: 3.46 MIL/uL — ABNORMAL LOW (ref 3.87–5.11)
RDW: 15.7 % — ABNORMAL HIGH (ref 11.5–15.5)
WBC: 6.2 10*3/uL (ref 4.0–10.5)
nRBC: 0 % (ref 0.0–0.2)

## 2018-02-26 LAB — COMPREHENSIVE METABOLIC PANEL
ALK PHOS: 39 U/L (ref 38–126)
ALT: 21 U/L (ref 0–44)
AST: 18 U/L (ref 15–41)
Albumin: 2 g/dL — ABNORMAL LOW (ref 3.5–5.0)
Anion gap: 10 (ref 5–15)
BUN: 25 mg/dL — AB (ref 8–23)
CO2: 24 mmol/L (ref 22–32)
Calcium: 8.1 mg/dL — ABNORMAL LOW (ref 8.9–10.3)
Chloride: 113 mmol/L — ABNORMAL HIGH (ref 98–111)
Creatinine, Ser: 1.01 mg/dL — ABNORMAL HIGH (ref 0.44–1.00)
GFR calc Af Amer: >60 mL/min (ref >60)
GFR calc non Af Amer: 58 mL/min — ABNORMAL LOW (ref >60)
Glucose, Bld: 85 mg/dL (ref 70–99)
Potassium: 2.9 mmol/L — ABNORMAL LOW (ref 3.5–5.1)
Sodium: 147 mmol/L — ABNORMAL HIGH (ref 135–145)
Total Bilirubin: 0.7 mg/dL (ref 0.3–1.2)
Total Protein: 4.9 g/dL — ABNORMAL LOW (ref 6.5–8.1)

## 2018-02-26 LAB — GLUCOSE, CAPILLARY
Glucose-Capillary: 145 mg/dL — ABNORMAL HIGH (ref 70–99)
Glucose-Capillary: 149 mg/dL — ABNORMAL HIGH (ref 70–99)
Glucose-Capillary: 158 mg/dL — ABNORMAL HIGH (ref 70–99)
Glucose-Capillary: 68 mg/dL — ABNORMAL LOW (ref 70–99)
Glucose-Capillary: 76 mg/dL (ref 70–99)
Glucose-Capillary: 78 mg/dL (ref 70–99)
Glucose-Capillary: 79 mg/dL (ref 70–99)

## 2018-02-26 LAB — MAGNESIUM: Magnesium: 2.1 mg/dL (ref 1.7–2.4)

## 2018-02-26 MED ORDER — INSULIN ASPART 100 UNIT/ML ~~LOC~~ SOLN
1.0000 [IU] | Freq: Three times a day (TID) | SUBCUTANEOUS | Status: DC
  Administered 2018-02-26: 1 [IU] via SUBCUTANEOUS
  Administered 2018-02-26: 2 [IU] via SUBCUTANEOUS

## 2018-02-26 MED ORDER — POTASSIUM CHLORIDE 10 MEQ/100ML IV SOLN
10.0000 meq | INTRAVENOUS | Status: AC
  Administered 2018-02-26 (×3): 10 meq via INTRAVENOUS
  Filled 2018-02-26 (×3): qty 100

## 2018-02-26 MED ORDER — DEXTROSE 5 % IV SOLN
INTRAVENOUS | Status: DC
  Administered 2018-02-26 – 2018-02-27 (×2): via INTRAVENOUS

## 2018-02-26 MED ORDER — POTASSIUM CHLORIDE 2 MEQ/ML IV SOLN
INTRAVENOUS | Status: DC
  Administered 2018-02-26: 10:00:00 via INTRAVENOUS
  Filled 2018-02-26 (×2): qty 1000

## 2018-02-26 MED ORDER — POTASSIUM CHLORIDE CRYS ER 20 MEQ PO TBCR
40.0000 meq | EXTENDED_RELEASE_TABLET | Freq: Once | ORAL | Status: AC
  Administered 2018-02-26: 20 meq via ORAL
  Filled 2018-02-26: qty 2

## 2018-02-26 MED ORDER — POTASSIUM CHLORIDE 10 MEQ/100ML IV SOLN
10.0000 meq | INTRAVENOUS | Status: AC
  Administered 2018-02-26 (×4): 10 meq via INTRAVENOUS
  Filled 2018-02-26 (×4): qty 100

## 2018-02-26 NOTE — Progress Notes (Signed)
2 Days Post-Op  Subjective: Alert and reasonably comfortable.  Sitting up in chair. Pain well controlled Passing flatus but no stool.  Denies nausea NG output low volume.  Voiding well.   Lab work shows WBC 6200.  Hemoglobin 10.7.  Potassium 2.9.  BUN 25.  Creatinine 1.01, better.  Sodium 147  Objective: Vital signs in last 24 hours: Temp:  [98.1 F (36.7 C)-98.7 F (37.1 C)] 98.2 F (36.8 C) (12/25 0447) Pulse Rate:  [67-73] 67 (12/25 0447) Resp:  [18-20] 20 (12/25 0447) BP: (112-132)/(61-67) 125/66 (12/25 0447) SpO2:  [96 %-100 %] 98 % (12/25 0447) Arterial Line BP: (140)/(60) 140/60 (12/24 0900) Last BM Date: 02/23/18  Intake/Output from previous day: 12/24 0701 - 12/25 0700 In: 836.1 [I.V.:665.5; IV Piggyback:170.6] Out: 1400 [Urine:1200; Emesis/NG output:200] Intake/Output this shift: Total I/O In: 707.5 [I.V.:620.9; IV Piggyback:86.7] Out: 200 [Emesis/NG output:200]  General appearance: Alert.  Mental status normal.  No distress Resp: Clear to auscultation GI: Soft.  Nondistended.  Wound clean.  Hypoactive bowel sounds Extremities: No cyanosis.  No edema.  No tenderness  Lab Results:  Results for orders placed or performed during the hospital encounter of 02/23/18 (from the past 24 hour(s))  Troponin I - Once     Status: None   Collection Time: 02/25/18  9:03 AM  Result Value Ref Range   Troponin I <0.03 <0.03 ng/mL  Glucose, capillary     Status: None   Collection Time: 02/25/18 11:59 AM  Result Value Ref Range   Glucose-Capillary 84 70 - 99 mg/dL  Glucose, capillary     Status: None   Collection Time: 02/25/18  4:50 PM  Result Value Ref Range   Glucose-Capillary 71 70 - 99 mg/dL  Glucose, capillary     Status: None   Collection Time: 02/25/18  8:01 PM  Result Value Ref Range   Glucose-Capillary 77 70 - 99 mg/dL  Glucose, capillary     Status: None   Collection Time: 02/26/18 12:39 AM  Result Value Ref Range   Glucose-Capillary 79 70 - 99 mg/dL   Glucose, capillary     Status: None   Collection Time: 02/26/18  4:40 AM  Result Value Ref Range   Glucose-Capillary 76 70 - 99 mg/dL  CBC with Differential/Platelet     Status: Abnormal   Collection Time: 02/26/18  5:15 AM  Result Value Ref Range   WBC 6.2 4.0 - 10.5 K/uL   RBC 3.46 (L) 3.87 - 5.11 MIL/uL   Hemoglobin 10.7 (L) 12.0 - 15.0 g/dL   HCT 16.133.0 (L) 09.636.0 - 04.546.0 %   MCV 95.4 80.0 - 100.0 fL   MCH 30.9 26.0 - 34.0 pg   MCHC 32.4 30.0 - 36.0 g/dL   RDW 40.915.7 (H) 81.111.5 - 91.415.5 %   Platelets 127 (L) 150 - 400 K/uL   nRBC 0.0 0.0 - 0.2 %   Neutrophils Relative % 70 %   Neutro Abs 4.3 1.7 - 7.7 K/uL   Lymphocytes Relative 21 %   Lymphs Abs 1.3 0.7 - 4.0 K/uL   Monocytes Relative 7 %   Monocytes Absolute 0.5 0.1 - 1.0 K/uL   Eosinophils Relative 1 %   Eosinophils Absolute 0.1 0.0 - 0.5 K/uL   Basophils Relative 0 %   Basophils Absolute 0.0 0.0 - 0.1 K/uL   Immature Granulocytes 1 %   Abs Immature Granulocytes 0.05 0.00 - 0.07 K/uL   Polychromasia PRESENT   Comprehensive metabolic panel     Status:  Abnormal   Collection Time: 02/26/18  5:15 AM  Result Value Ref Range   Sodium 147 (H) 135 - 145 mmol/L   Potassium 2.9 (L) 3.5 - 5.1 mmol/L   Chloride 113 (H) 98 - 111 mmol/L   CO2 24 22 - 32 mmol/L   Glucose, Bld 85 70 - 99 mg/dL   BUN 25 (H) 8 - 23 mg/dL   Creatinine, Ser 0.981.01 (H) 0.44 - 1.00 mg/dL   Calcium 8.1 (L) 8.9 - 10.3 mg/dL   Total Protein 4.9 (L) 6.5 - 8.1 g/dL   Albumin 2.0 (L) 3.5 - 5.0 g/dL   AST 18 15 - 41 U/L   ALT 21 0 - 44 U/L   Alkaline Phosphatase 39 38 - 126 U/L   Total Bilirubin 0.7 0.3 - 1.2 mg/dL   GFR calc non Af Amer 58 (L) >60 mL/min   GFR calc Af Amer >60 >60 mL/min   Anion gap 10 5 - 15     Studies/Results: No results found.  . heparin  5,000 Units Subcutaneous Q8H  . hydrocortisone sod succinate (SOLU-CORTEF) inj  50 mg Intravenous Daily  . insulin aspart  1-3 Units Subcutaneous Q4H  . pantoprazole (PROTONIX) IV  40 mg Intravenous  Q12H  . scopolamine  1 patch Transdermal Q72H     Assessment/Plan: s/p Procedure(s): LAPAROSCOPIC ASSISTED/SMALL BOWEL RESECTION convereted to open   POD #2.  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  Treat with IV therapy  GERD, chronic Hypertension Nephrolithiasis  @PROBHOSP @  LOS: 3 days    Ernestene MentionHaywood M Teejay Meader 02/26/2018  . .prob

## 2018-02-26 NOTE — Progress Notes (Signed)
PROGRESS NOTE    TARALEE MARCUS  ZOX:[id number] DOB: 1951-01-02 DOA: 02/23/2018 PCP: Benita Stabile, MD   Brief Narrative: Patient is a 67 year old female with past medical history of GERD: Hypertension, nephrolithiasis, osteopenia who presented with abdominal pain, nausea/vomiting, diarrhea.  She was found to be in septic shock on presentation suspected to be from intra-abdominal source.  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  She was admitted under PCCM service.  General surgery consulted.  Patient was started on broad start antibiotics, IV fluids and started on vasopressors She was found to have gallstone eroding through the gallbladder wall and  obstructing the distal bowel with SBO.She underwent  Laparotomy and gallstone removal through enterotomy and terminal ileum for gallstone ileus.  Patient is currently hemodynamically stable.  Patient transferred to [facility] service on 02/26/2018.  Currently blood cultures are showing Klebsiella pneumonia, waiting for sensitivity.  Assessment & Plan:   Active Problems:   Sepsis (HCC)   Shock (HCC)   Gallstone ileus (HCC)   Pneumobilia   Change in bowel habits  Septic shock/Klebsiella bacteremia: Secondary to cholecystoenteric and fistula.  Was initially admitted under PCCM service.  Started on vasopressors on admission.  Currently blood pressure stable.  Off vasopressors.  Blood cultures showing Klebsiella pneumoniae.  We will follow-up final sensitivity report.  Continue current antibiotics.  Currently on cefepime.  Continue current fluids.  Cholecysto enteric fistula/SBO/gallstone ileus:She was found to have gallstone eroding through the gallbladder wall and obstruction of the distal bowel with SBO.   General surgery following.S/P  Laparotomy and gallstone removal through enterotomy and terminal ileum for gallstone ileus Postop day 2.  NG tube removed today.  Started on clear liquid diet.  Acute kidney injury: Improving.   Continue gentle IV fluids.  Hypokalemia: Currently being supplemented.  Will check magnesium level.  Hypernatremia: Started on D5W.  Will check BMP tomorrow.  Suspected upper GI bleed: Fecal occult blood test positive.  Suspected to be secondary to gallstone  eroding small intestine.  No active bleeding.  GI signed off.  Continue PPI.  Continue to monitor CBC.  Transient A. fib: Currently in normal sinus rhythm.  Monitor on telemetry.  Peripheral eosinophilia/drug reaction: Suspected to be from Dilaudid.  Resolved.  Mild thrombocytopenia: We will continue to monitor.   Nutrition Problem: Inadequate oral intake Etiology: acute illness(gallstone ileus s/p ex lap )      DVT prophylaxis: SCD Code Status: Full Family Communication: None present at the bedside Disposition Plan: Pending PT evaluation.  Depends upon surgical clearance  Consultants: General surgery, GI, PCCM  Procedures: Laparotomy  Antimicrobials: Cefepime  Subjective: Patient seen and examined the bedside this morning.  Remains comfortable.  Hemodynamically stable.  AFebrile.  NG tube taken out today.  Complains of some abdominal pain.  No bowel movement yet.  Passing gas  Objective: Vitals:   02/25/18 1047 02/25/18 1339 02/25/18 2004 02/26/18 0447  BP: 112/67 119/64 132/64 125/66  Pulse: 68 72 73 67  Resp: 18 18 20 20   Temp: 98.3 F (36.8 C) 98.1 F (36.7 C) 98.7 F (37.1 C) 98.2 F (36.8 C)  TempSrc: Oral Oral Oral Oral  SpO2: 98% 98% 96% 98%  Weight:      Height:        Intake/Output Summary (Last 24 hours) at 02/26/2018 1137 Last data filed at 02/26/2018 8119 Gross per 24 hour  Intake 1323.02 ml  Output 1325 ml  Net -1.98 ml   Filed Weights   02/23/18  1109 02/25/18 0600  Weight: 91.2 kg 106.2 kg    Examination:  General exam: Appears calm and comfortable ,Not in distress,obese HEENT:PERRL,Oral mucosa moist, Ear/Nose normal on gross exam Respiratory system: Bilateral equal air entry,  normal vesicular breath sounds, no wheezes or crackles  Cardiovascular system: S1 & S2 heard, RRR. No JVD, murmurs, rubs, gallops or clicks. Trace pedal edema. Gastrointestinal system: Abdomen is nondistended, soft .  Clean surgical wound.  Appropriate tenderness on the surgical sites. No organomegaly or masses felt. Sluggish bowel sounds. Central nervous system: Alert and oriented. No focal neurological deficits. Extremities: Trace edema, no clubbing ,no cyanosis, distal peripheral pulses palpable. Skin: No rashes, lesions or ulcers,no icterus ,no pallor MSK: Normal muscle bulk,tone ,power Psychiatry: Judgement and insight appear normal. Mood & affect appropriate.     Data Reviewed: I have personally reviewed following labs and imaging studies  CBC: Recent Labs  Lab 02/23/18 1253  02/23/18 2022 02/24/18 0317 02/24/18 1503 02/25/18 0451 02/26/18 0515  WBC 15.8*  --  12.6* 9.7 6.8 6.4 6.2  NEUTROABS 13.1*  --  10.6* 6.3  --  5.3 4.3  HGB 14.0   < > 12.4 11.4* 10.8* 10.7* 10.7*  HCT 43.9   < > 38.0 35.5* 34.4* 33.1* 33.0*  MCV 94.8  --  92.5 92.7 95.3 95.9 95.4  PLT 181  --  173 145* 124* 122* 127*   < > = values in this interval not displayed.   Basic Metabolic Panel: Recent Labs  Lab 02/23/18 1253 02/23/18 1257 02/23/18 2022 02/24/18 0317 02/25/18 0451 02/26/18 0515  NA 140 139 136 137 144 147*  K 3.4* 3.5 3.0* 3.8 3.6 2.9*  CL 102 100 102 107 113* 113*  CO2 23  --  22 23 22 24   GLUCOSE 137* 130* 169* 190* 111* 85  BUN 52* 45* 50* 44* 29* 25*  CREATININE 3.73* 3.80* 3.42* 2.60* 1.30* 1.01*  CALCIUM 8.7*  --  7.9* 8.1* 8.0* 8.1*  MG  --   --  1.7 2.1  --   --   PHOS  --   --  3.9  --   --   --    GFR: Estimated Creatinine Clearance: 63.1 mL/min (A) (by C-G formula based on SCr of 1.01 mg/dL (H)). Liver Function Tests: Recent Labs  Lab 02/23/18 1253 02/23/18 2022 02/24/18 0317 02/25/18 0451 02/26/18 0515  AST 30 30 30 20 18   ALT 30 28 28 25 21   ALKPHOS 49  41 42 36* 39  BILITOT 0.5 1.0 1.0 0.6 0.7  PROT 6.5 5.4* 5.1* 5.1* 4.9*  ALBUMIN 3.3* 2.5* 2.3* 2.2* 2.0*   Recent Labs  Lab 02/23/18 1200  LIPASE 20   No results for input(s): AMMONIA in the last 168 hours. Coagulation Profile: Recent Labs  Lab 02/23/18 1251  INR 1.23   Cardiac Enzymes: Recent Labs  Lab 02/23/18 1257 02/25/18 0903  TROPONINI 0.06* <0.03   BNP (last 3 results) No results for input(s): PROBNP in the last 8760 hours. HbA1C: Recent Labs    02/23/18 2027  HGBA1C 5.6   CBG: Recent Labs  Lab 02/25/18 2001 02/26/18 0039 02/26/18 0440 02/26/18 0822 02/26/18 0850  GLUCAP 77 79 76 68* 78   Lipid Profile: No results for input(s): CHOL, HDL, LDLCALC, TRIG, CHOLHDL, LDLDIRECT in the last 72 hours. Thyroid Function Tests: No results for input(s): TSH, T4TOTAL, FREET4, T3FREE, THYROIDAB in the last 72 hours. Anemia Panel: No results for input(s): VITAMINB12, FOLATE, FERRITIN, TIBC, IRON,  RETICCTPCT in the last 72 hours. Sepsis Labs: Recent Labs  Lab 02/23/18 1158 02/23/18 1253 02/23/18 1458 02/24/18 0350  LATICACIDVEN 5.69* 4.2* 2.08* 1.1    Recent Results (from the past 240 hour(s))  Blood Culture (routine x 2)     Status: Abnormal (Preliminary result)   Collection Time: 02/23/18 12:56 PM  Result Value Ref Range Status   Specimen Description   Final    BLOOD RIGHT ARM Performed at [facility], [address] [place], [place] [address]    Special Requests   Final    BOTTLES DRAWN AEROBIC AND ANAEROBIC Blood Culture adequate volume Performed at [facility], [address] [place], [place] [address]    Culture  Setup Time   Final    AEROBIC BOTTLE ONLY GRAM NEGATIVE RODS Gram Stain Report Called to,Read Back By and Verified With: J PARRISH,RN @2238  02/24/18 MKELLY Organism ID to follow CRITICAL RESULT CALLED TO, READ BACK BY AND VERIFIED WITH: Evelena PeatG ABBOTT [facility] 02/25/18 0422 JDW Performed at [facility], [address]  [place], [place] [address]    Culture KLEBSIELLA PNEUMONIAE (A)  Final   Report Status PENDING  Incomplete  Blood Culture ID Panel (Reflexed)     Status: Abnormal   Collection Time: 02/23/18 12:56 PM  Result Value Ref Range Status   Enterococcus species NOT DETECTED NOT DETECTED Final   Listeria monocytogenes NOT DETECTED NOT DETECTED Final   Staphylococcus species NOT DETECTED NOT DETECTED Final   Staphylococcus aureus (BCID) NOT DETECTED NOT DETECTED Final   Streptococcus species NOT DETECTED NOT DETECTED Final   Streptococcus agalactiae NOT DETECTED NOT DETECTED Final   Streptococcus pneumoniae NOT DETECTED NOT DETECTED Final   Streptococcus pyogenes NOT DETECTED NOT DETECTED Final   Acinetobacter baumannii NOT DETECTED NOT DETECTED Final   Enterobacteriaceae species DETECTED (A) NOT DETECTED Final    Comment: Enterobacteriaceae represent a large family of gram-negative bacteria, not a single organism. CRITICAL RESULT CALLED TO, READ BACK BY AND VERIFIED WITH: G ABBOTT PHARMD 02/25/18 0422 JDW    Enterobacter cloacae complex NOT DETECTED NOT DETECTED Final   Escherichia coli NOT DETECTED NOT DETECTED Final   Klebsiella oxytoca NOT DETECTED NOT DETECTED Final   Klebsiella pneumoniae DETECTED (A) NOT DETECTED Final    Comment: CRITICAL RESULT CALLED TO, READ BACK BY AND VERIFIED WITH: G ABBOTT PHARMD 02/25/18 0422 JDW    Proteus species NOT DETECTED NOT DETECTED Final   Serratia marcescens NOT DETECTED NOT DETECTED Final   Carbapenem resistance NOT DETECTED NOT DETECTED Final   Haemophilus influenzae NOT DETECTED NOT DETECTED Final   Neisseria meningitidis NOT DETECTED NOT DETECTED Final   Pseudomonas aeruginosa NOT DETECTED NOT DETECTED Final   Candida albicans NOT DETECTED NOT DETECTED Final   Candida glabrata NOT DETECTED NOT DETECTED Final   Candida krusei NOT DETECTED NOT DETECTED Final   Candida parapsilosis NOT DETECTED NOT DETECTED Final   Candida tropicalis NOT DETECTED NOT  DETECTED Final    Comment: Performed at [facility], [address] [place], [place] [address]  Blood Culture (routine x 2)     Status: None (Preliminary result)   Collection Time: 02/23/18  2:36 PM  Result Value Ref Range Status   Specimen Description BLOOD CENTRAL LINE  Final   Special Requests   Final    BOTTLES DRAWN AEROBIC AND ANAEROBIC Blood Culture adequate volume   Culture   Final    NO GROWTH 3 DAYS Performed at [facility],  [address] [place], [place] [address]    Report Status PENDING  Incomplete  MRSA PCR Screening     Status: None   Collection Time: 02/23/18  7:01 PM  Result Value Ref Range Status   MRSA by PCR NEGATIVE NEGATIVE Final    Comment:        The GeneXpert MRSA Assay (FDA approved for NASAL specimens only), is one component of a comprehensive MRSA colonization surveillance program. It is not intended to diagnose MRSA infection nor to guide or monitor treatment for MRSA infections. Performed at [facility], [address], [place], [place] [address]   Culture, Urine     Status: None   Collection Time: 02/24/18  4:38 AM  Result Value Ref Range Status   Specimen Description URINE, CATHETERIZED  Final   Special Requests NONE  Final   Culture   Final    NO GROWTH Performed at [facility], [address], [place], [place] [address]    Report Status 02/25/2018 FINAL  Final         Radiology Studies: No results found.      Scheduled Meds: . heparin  5,000 Units Subcutaneous Q8H  . hydrocortisone sod succinate (SOLU-CORTEF) inj  50 mg Intravenous Daily  . insulin aspart  1-3 Units Subcutaneous Q4H  . pantoprazole (PROTONIX) IV  40 mg Intravenous Q12H  . potassium chloride  40 mEq Oral Once  . scopolamine  1 patch Transdermal Q72H   Continuous Infusions: . sodium chloride    . ceFEPime (MAXIPIME) IV 1 g (02/26/18 1043)  . dextrose    . lactated ringers 75 mL/hr at 02/26/18 0800  . potassium chloride 10 mEq  (02/26/18 1046)     LOS: 3 days    Time spent: 35 mins.More than 50% of that time was spent in counseling and/or coordination of care.      Burnadette Pop, MD [facility] Pager [phone]  If 7PM-7AM, please contact night-coverage [email or web address] Password [facility] 02/26/2018, 11:37 AM

## 2018-02-27 ENCOUNTER — Inpatient Hospital Stay (HOSPITAL_COMMUNITY): Payer: Medicare Other

## 2018-02-27 LAB — CBC
HCT: 32.8 % — ABNORMAL LOW (ref 36.0–46.0)
Hemoglobin: 10.7 g/dL — ABNORMAL LOW (ref 12.0–15.0)
MCH: 30.4 pg (ref 26.0–34.0)
MCHC: 32.6 g/dL (ref 30.0–36.0)
MCV: 93.2 fL (ref 80.0–100.0)
Platelets: 139 10*3/uL — ABNORMAL LOW (ref 150–400)
RBC: 3.52 MIL/uL — AB (ref 3.87–5.11)
RDW: 15.6 % — ABNORMAL HIGH (ref 11.5–15.5)
WBC: 4.7 10*3/uL (ref 4.0–10.5)
nRBC: 0 % (ref 0.0–0.2)

## 2018-02-27 LAB — BASIC METABOLIC PANEL
Anion gap: 7 (ref 5–15)
BUN: 15 mg/dL (ref 8–23)
CO2: 24 mmol/L (ref 22–32)
Calcium: 7.9 mg/dL — ABNORMAL LOW (ref 8.9–10.3)
Chloride: 110 mmol/L (ref 98–111)
Creatinine, Ser: 0.77 mg/dL (ref 0.44–1.00)
GFR calc Af Amer: >60 mL/min (ref >60)
Glucose, Bld: 154 mg/dL — ABNORMAL HIGH (ref 70–99)
Potassium: 3.7 mmol/L (ref 3.5–5.1)
Sodium: 141 mmol/L (ref 135–145)

## 2018-02-27 LAB — GLUCOSE, CAPILLARY
Glucose-Capillary: 116 mg/dL — ABNORMAL HIGH (ref 70–99)
Glucose-Capillary: 109 mg/dL — ABNORMAL HIGH (ref 70–99)
Glucose-Capillary: 109 mg/dL — ABNORMAL HIGH (ref 70–99)
Glucose-Capillary: 168 mg/dL — ABNORMAL HIGH (ref 70–99)

## 2018-02-27 MED ORDER — SODIUM CHLORIDE 0.9 % IV SOLN
2.0000 g | INTRAVENOUS | Status: DC
  Administered 2018-02-27: 2 g via INTRAVENOUS
  Filled 2018-02-27: qty 20

## 2018-02-27 MED ORDER — ADULT MULTIVITAMIN W/MINERALS CH
1.0000 | ORAL_TABLET | Freq: Every day | ORAL | Status: DC
  Administered 2018-02-27 – 2018-03-01 (×3): 1 via ORAL
  Filled 2018-02-27 (×3): qty 1

## 2018-02-27 MED ORDER — CEFAZOLIN SODIUM-DEXTROSE 2-4 GM/100ML-% IV SOLN
2.0000 g | Freq: Three times a day (TID) | INTRAVENOUS | Status: DC
  Filled 2018-02-27: qty 100

## 2018-02-27 MED ORDER — ENSURE ENLIVE PO LIQD
237.0000 mL | Freq: Three times a day (TID) | ORAL | Status: DC
  Administered 2018-02-27 – 2018-03-01 (×5): 237 mL via ORAL

## 2018-02-27 NOTE — Care Management Important Message (Signed)
Important Message  Patient Details  Name: Joanna Good MRN: [id number] Date of Birth: 16-May-1950   Medicare Important Message Given:  Yes    Aluel Schwarz P Hashem Goynes 02/27/2018, 5:04 PM

## 2018-02-27 NOTE — Progress Notes (Signed)
Inpatient Diabetes Program Recommendations  AACE/ADA: New Consensus Statement on Inpatient Glycemic Control (2015)  Target Ranges:  Prepandial:   less than 140 mg/dL      Peak postprandial:   less than 180 mg/dL (1-2 hours)      Critically ill patients:  140 - 180 mg/dL   Lab Results  Component Value Date   GLUCAP 109 (H) 02/27/2018   HGBA1C 5.6 02/23/2018    Review of Glycemic ControlResults for Marin ShutterKING, Joanna Good (MRN [id number]) as of 02/27/2018 13:21  Ref. Range 02/26/2018 12:06 02/26/2018 17:20 02/26/2018 21:04 02/27/2018 07:55 02/27/2018 11:53  Glucose-Capillary Latest Ref Range: 70 - 99 mg/dL 629149 (H) 528145 (H) 413158 (H) 116 (H) 109 (H)    Diabetes history: None Outpatient Diabetes medications: None Current orders for Inpatient glycemic control: Novolog 1-2-3 units tid with meals and HS Inpatient Diabetes Program Recommendations:   No history of DM noted. Consider d/c of Novolog correction.  Sent secure message to MD.   Thanks  Joanna MeagerJenny Bryley Kovacevic, RN, BC-ADM Inpatient Diabetes Coordinator Pager [phone] (8a-5p)

## 2018-02-27 NOTE — Progress Notes (Signed)
PROGRESS NOTE    Joanna Good  EAV:[id number] DOB: 1950-06-14 DOA: 02/23/2018 PCP: Benita Stabile, MD   Brief Narrative: Patient is a 67 year old female with past medical history of GERD, Hypertension, nephrolithiasis, osteopenia who presented with abdominal pain, nausea/vomiting, diarrhea.  She was found to be in septic shock on presentation suspected to be from intra-abdominal source.  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  She was admitted under PCCM service.  General surgery consulted.  Patient was started on broad start antibiotics, IV fluids and started on vasopressors She was found to have gallstone eroding through the gallbladder wall and  obstructing the distal bowel with SBO.She underwent  Laparotomy and gallstone removal through enterotomy and terminal ileum for gallstone ileus.  Patient is currently hemodynamically stable.  Patient transferred to [facility] service on 02/26/2018.  Currently blood cultures are showing Klebsiella pneumonia.  Assessment & Plan:   Active Problems:   Sepsis (HCC)   Shock (HCC)   Gallstone ileus (HCC)   Pneumobilia   Change in bowel habits  Septic shock/Klebsiella bacteremia: Secondary to cholecystoenteric and fistula.  Was initially admitted under PCCM service.  Started on vasopressors on admission.  Currently blood pressure stable.  Off vasopressors.  Blood cultures showing pansenstive Klebsiella pneumoniae.  We will follow-up final sensitivity report.  Continue current antibiotics.  Currently on cefepime. Will change to ceftriaxone.  We will change antibiotic to oral when she is ready for discharge.  Plan for total of 14 days of antibiotics from start date.  Cholecysto enteric fistula/SBO/gallstone ileus:She was found to have gallstone eroding through the gallbladder wall and obstruction of the distal bowel with SBO.   General surgery following.S/P  Laparotomy and gallstone removal through enterotomy and terminal ileum for gallstone  ileus Postop day 3.  Off NG tube removed today.  Started on full liquid diet.  Acute kidney injury: Resolved  Hypokalemia: Supplemented   Hypernatremia: Started on gentle  D5W.  Will check BMP tomorrow.Improving.  Suspected upper GI bleed: Fecal occult blood test positive.  Suspected to be secondary to gallstone  eroding small intestine.  No active bleeding.  GI signed off.  Continue PPI.  Continue to monitor CBC.  Transient A. fib: Currently in normal sinus rhythm.  Monitor on telemetry.  Peripheral eosinophilia/drug reaction: Suspected to be from Dilaudid.  Resolved.  Mild thrombocytopenia: We will continue to monitor.   Nutrition Problem: Inadequate oral intake Etiology: acute illness(gallstone ileus s/p ex lap )      DVT prophylaxis: SCD Code Status: Full Family Communication: None present at the bedside Disposition Plan: Pending PT evaluation.  Depends upon surgical clearance  Consultants: General surgery, GI, PCCM  Procedures: Laparotomy  Antimicrobials: Ceftriaxone day 1, Was on cefepime from 12/22-12/25  Subjective: Patient seen and examined the bedside this morning.  Remains comfortable.  Hemodynamically stable.  AFebrile.  NG tube taken out on 02/26/18.  Had a bowel movement yesterday.  Does not complain of abdominal pain.  Objective: Vitals:   02/26/18 0447 02/26/18 1348 02/26/18 2048 02/27/18 0534  BP: 125/66 127/68 (!) 126/56 125/63  Pulse: 67 66 67 62  Resp: 20 18 20 18   Temp: 98.2 F (36.8 C) 98.8 F (37.1 C) 98.6 F (37 C) 98.4 F (36.9 C)  TempSrc: Oral Oral Oral Oral  SpO2: 98% 100% 99% 99%  Weight:      Height:        Intake/Output Summary (Last 24 hours) at 02/27/2018 1057 Last data filed at 02/27/2018 0900  Gross per 24 hour  Intake 1259.04 ml  Output 1200 ml  Net 59.04 ml   Filed Weights   02/23/18 1109 02/25/18 0600  Weight: 91.2 kg 106.2 kg    Examination:  General exam: Appears calm and comfortable ,Not in  distress,obese HEENT:PERRL,Oral mucosa moist, Ear/Nose normal on gross exam Respiratory system: Bilateral equal air entry, normal vesicular breath sounds, no wheezes or crackles  Cardiovascular system: S1 & S2 heard, RRR. No JVD, murmurs, rubs, gallops or clicks. Trace pedal edema. Gastrointestinal system: Abdomen is nondistended, soft .  Clean surgical wound.  Appropriate tenderness on the surgical sites. No organomegaly or masses felt. Normal  bowel sounds. Central nervous system: Alert and oriented. No focal neurological deficits. Extremities: Trace edema, no clubbing ,no cyanosis, distal peripheral pulses palpable. Skin: No rashes, lesions or ulcers,no icterus ,no pallor MSK: Normal muscle bulk,tone ,power Psychiatry: Judgement and insight appear normal. Mood & affect appropriate.     Data Reviewed: I have personally reviewed following labs and imaging studies  CBC: Recent Labs  Lab 02/23/18 1253  02/23/18 2022 02/24/18 0317 02/24/18 1503 02/25/18 0451 02/26/18 0515 02/27/18 0249  WBC 15.8*  --  12.6* 9.7 6.8 6.4 6.2 4.7  NEUTROABS 13.1*  --  10.6* 6.3  --  5.3 4.3  --   HGB 14.0   < > 12.4 11.4* 10.8* 10.7* 10.7* 10.7*  HCT 43.9   < > 38.0 35.5* 34.4* 33.1* 33.0* 32.8*  MCV 94.8  --  92.5 92.7 95.3 95.9 95.4 93.2  PLT 181  --  173 145* 124* 122* 127* 139*   < > = values in this interval not displayed.   Basic Metabolic Panel: Recent Labs  Lab 02/23/18 2022 02/24/18 0317 02/25/18 0451 02/26/18 0515 02/27/18 0249  NA 136 137 144 147* 141  K 3.0* 3.8 3.6 2.9* 3.7  CL 102 107 113* 113* 110  CO2 22 23 22 24 24   GLUCOSE 169* 190* 111* 85 154*  BUN 50* 44* 29* 25* 15  CREATININE 3.42* 2.60* 1.30* 1.01* 0.77  CALCIUM 7.9* 8.1* 8.0* 8.1* 7.9*  MG 1.7 2.1  --  2.1  --   PHOS 3.9  --   --   --   --    GFR: Estimated Creatinine Clearance: 79.6 mL/min (by C-G formula based on SCr of 0.77 mg/dL). Liver Function Tests: Recent Labs  Lab 02/23/18 1253 02/23/18 2022  02/24/18 0317 02/25/18 0451 02/26/18 0515  AST 30 30 30 20 18   ALT 30 28 28 25 21   ALKPHOS 49 41 42 36* 39  BILITOT 0.5 1.0 1.0 0.6 0.7  PROT 6.5 5.4* 5.1* 5.1* 4.9*  ALBUMIN 3.3* 2.5* 2.3* 2.2* 2.0*   Recent Labs  Lab 02/23/18 1200  LIPASE 20   No results for input(s): AMMONIA in the last 168 hours. Coagulation Profile: Recent Labs  Lab 02/23/18 1251  INR 1.23   Cardiac Enzymes: Recent Labs  Lab 02/23/18 1257 02/25/18 0903  TROPONINI 0.06* <0.03   BNP (last 3 results) No results for input(s): PROBNP in the last 8760 hours. HbA1C: No results for input(s): HGBA1C in the last 72 hours. CBG: Recent Labs  Lab 02/26/18 0850 02/26/18 1206 02/26/18 1720 02/26/18 2104 02/27/18 0755  GLUCAP 78 149* 145* 158* 116*   Lipid Profile: No results for input(s): CHOL, HDL, LDLCALC, TRIG, CHOLHDL, LDLDIRECT in the last 72 hours. Thyroid Function Tests: No results for input(s): TSH, T4TOTAL, FREET4, T3FREE, THYROIDAB in the last 72 hours. Anemia Panel: No  results for input(s): VITAMINB12, FOLATE, FERRITIN, TIBC, IRON, RETICCTPCT in the last 72 hours. Sepsis Labs: Recent Labs  Lab 02/23/18 1158 02/23/18 1253 02/23/18 1458 02/24/18 0350  LATICACIDVEN 5.69* 4.2* 2.08* 1.1    Recent Results (from the past 240 hour(s))  Blood Culture (routine x 2)     Status: Abnormal (Preliminary result)   Collection Time: 02/23/18 12:56 PM  Result Value Ref Range Status   Specimen Description   Final    BLOOD RIGHT ARM Performed at [facility], [address] [place], [place] [address]    Special Requests   Final    BOTTLES DRAWN AEROBIC AND ANAEROBIC Blood Culture adequate volume Performed at [facility], [address] [place], [place] [address]    Culture  Setup Time   Final    IN BOTH AEROBIC AND ANAEROBIC BOTTLES GRAM NEGATIVE RODS Gram Stain Report Called to,Read Back By and Verified With: J PARRISH,RN @2238  02/24/18 MKELLY CRITICAL RESULT CALLED TO, READ BACK BY AND  VERIFIED WITH: G ABBOTT PHARMD 02/25/18 0422 JDW    Culture (A)  Final    KLEBSIELLA PNEUMONIAE CULTURE REINCUBATED FOR BETTER GROWTH Performed at [facility], [address] [place], [place] [address]    Report Status PENDING  Incomplete   Organism ID, Bacteria KLEBSIELLA PNEUMONIAE  Final      Susceptibility   Klebsiella pneumoniae - MIC*    AMPICILLIN >=32 RESISTANT Resistant     CEFAZOLIN <=4 SENSITIVE Sensitive     CEFEPIME <=1 SENSITIVE Sensitive     CEFTAZIDIME <=1 SENSITIVE Sensitive     CEFTRIAXONE <=1 SENSITIVE Sensitive     CIPROFLOXACIN <=0.25 SENSITIVE Sensitive     GENTAMICIN <=1 SENSITIVE Sensitive     IMIPENEM <=0.25 SENSITIVE Sensitive     TRIMETH/SULFA <=20 SENSITIVE Sensitive     AMPICILLIN/SULBACTAM 4 SENSITIVE Sensitive     PIP/TAZO <=4 SENSITIVE Sensitive     Extended ESBL NEGATIVE Sensitive     * KLEBSIELLA PNEUMONIAE  Blood Culture ID Panel (Reflexed)     Status: Abnormal   Collection Time: 02/23/18 12:56 PM  Result Value Ref Range Status   Enterococcus species NOT DETECTED NOT DETECTED Final   Listeria monocytogenes NOT DETECTED NOT DETECTED Final   Staphylococcus species NOT DETECTED NOT DETECTED Final   Staphylococcus aureus (BCID) NOT DETECTED NOT DETECTED Final   Streptococcus species NOT DETECTED NOT DETECTED Final   Streptococcus agalactiae NOT DETECTED NOT DETECTED Final   Streptococcus pneumoniae NOT DETECTED NOT DETECTED Final   Streptococcus pyogenes NOT DETECTED NOT DETECTED Final   Acinetobacter baumannii NOT DETECTED NOT DETECTED Final   Enterobacteriaceae species DETECTED (A) NOT DETECTED Final    Comment: Enterobacteriaceae represent a large family of gram-negative bacteria, not a single organism. CRITICAL RESULT CALLED TO, READ BACK BY AND VERIFIED WITH: G ABBOTT PHARMD 02/25/18 0422 JDW    Enterobacter cloacae complex NOT DETECTED NOT DETECTED Final   Escherichia coli NOT DETECTED NOT DETECTED Final   Klebsiella oxytoca NOT  DETECTED NOT DETECTED Final   Klebsiella pneumoniae DETECTED (A) NOT DETECTED Final    Comment: CRITICAL RESULT CALLED TO, READ BACK BY AND VERIFIED WITH: G ABBOTT PHARMD 02/25/18 0422 JDW    Proteus species NOT DETECTED NOT DETECTED Final   Serratia marcescens NOT DETECTED NOT DETECTED Final   Carbapenem resistance NOT DETECTED NOT DETECTED Final   Haemophilus influenzae NOT DETECTED NOT DETECTED Final   Neisseria meningitidis NOT DETECTED NOT DETECTED Final   Pseudomonas aeruginosa NOT DETECTED  NOT DETECTED Final   Candida albicans NOT DETECTED NOT DETECTED Final   Candida glabrata NOT DETECTED NOT DETECTED Final   Candida krusei NOT DETECTED NOT DETECTED Final   Candida parapsilosis NOT DETECTED NOT DETECTED Final   Candida tropicalis NOT DETECTED NOT DETECTED Final    Comment: Performed at [facility], [address], [place], [place] [address]  Blood Culture (routine x 2)     Status: None (Preliminary result)   Collection Time: 02/23/18  2:36 PM  Result Value Ref Range Status   Specimen Description BLOOD CENTRAL LINE  Final   Special Requests   Final    BOTTLES DRAWN AEROBIC AND ANAEROBIC Blood Culture adequate volume   Culture   Final    NO GROWTH 4 DAYS Performed at [facility], [address] [place], [place] [address]    Report Status PENDING  Incomplete  MRSA PCR Screening     Status: None   Collection Time: 02/23/18  7:01 PM  Result Value Ref Range Status   MRSA by PCR NEGATIVE NEGATIVE Final    Comment:        The GeneXpert MRSA Assay (FDA approved for NASAL specimens only), is one component of a comprehensive MRSA colonization surveillance program. It is not intended to diagnose MRSA infection nor to guide or monitor treatment for MRSA infections. Performed at [facility], [address] [place], [place] [address]   Culture, Urine     Status: None   Collection Time: 02/24/18  4:38 AM  Result Value Ref Range Status   Specimen Description  URINE, CATHETERIZED  Final   Special Requests NONE  Final   Culture   Final    NO GROWTH Performed at [facility], [address], [place], [place] [address]    Report Status 02/25/2018 FINAL  Final         Radiology Studies: Dg Chest 2 View  Result Date: 02/27/2018 CLINICAL DATA:  67 year old female postoperative day 3 from laparotomy for treatment of gallstone ileus (surgical removal of 2 obstructing gallstones from the ileum). Cough. EXAM: CHEST - 2 VIEW COMPARISON:  02/24/2018 and earlier. FINDINGS: Semi upright AP and lateral views of the chest. Small or trace pleural effusion(s) suspected on the lateral view. Stable lung volumes and mediastinal contours. No pneumothorax, pulmonary edema or consolidation. Negative visible bowel gas pattern in the upper abdomen. No acute osseous abnormality identified. IMPRESSION: Small or trace pleural effusion(s) suspected, but no other acute cardiopulmonary abnormality. Electronically Signed   By: Odessa Fleming M.D.   On: 02/27/2018 08:14        Scheduled Meds: . heparin  5,000 Units Subcutaneous Q8H  . insulin aspart  1-3 Units Subcutaneous TID AC & HS  . pantoprazole (PROTONIX) IV  40 mg Intravenous Q12H  . scopolamine  1 patch Transdermal Q72H   Continuous Infusions: . sodium chloride    . ceFEPime (MAXIPIME) IV 1 g (02/27/18 1009)  . dextrose 50 mL/hr at 02/27/18 0532     LOS: 4 days    Time spent: 35 mins.More than 50% of that time was spent in counseling and/or coordination of care.      Burnadette Pop, MD [facility] Pager [phone]  If 7PM-7AM, please contact night-coverage [email or web address] Password [facility] 02/27/2018, 10:57 AM

## 2018-02-27 NOTE — Evaluation (Signed)
Physical Therapy Evaluation Patient Details Name: Joanna Good MRN: [id number] DOB: [birth year] Today's Date: 02/27/2018   History of Present Illness  Patient is a 67 year old female with past medical history of GERD: Hypertension, nephrolithiasis, osteopenia who presented with abdominal pain, nausea/vomiting, diarrhea.  She was found to be in septic shock on presentation suspected to be from intra-abdominal source.  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 S/P  Laparotomy and gallstone removal through enterotomy and terminal ileum for gallstone ileus  Clinical Impression   Patient is s/p above surgery resulting in functional limitations due to the deficits listed below (see PT Problem List). Independent prior to admission; Present with generalized weakness, decr activity tolerance; Still, feels much better than at admission, and I anticipate good progress;  Patient will benefit from skilled PT to increase their independence and safety with mobility to allow discharge to the venue listed below.       Follow Up Recommendations Home health PT;Supervision - Intermittent;Other (comment)(I anticipate good progress - May not need HHPT; she does live alone, though; she is interested in some sort of post-op visit, asking about a SW or RN home visit)    Equipment Recommendations  Rolling walker with 5" wheels;3in1 (PT)    Recommendations for Other Services       Precautions / Restrictions Precautions Precautions: Other (comment) Precaution Comments: Consider log rolling due to abdominal pain Restrictions Weight Bearing Restrictions: No      Mobility  Bed Mobility                  Transfers Overall transfer level: Needs assistance Equipment used: Rolling walker (2 wheeled) Transfers: Sit to/from Stand Sit to Stand: Min guard;Min assist         General transfer comment: Minguard standing from recliner with armrests; min assist to steady RW standing from  toilet  Ambulation/Gait Ambulation/Gait assistance: Min guard Gait Distance (Feet): 300 Feet Assistive device: Rolling walker (2 wheeled) Gait Pattern/deviations: Step-through pattern     General Gait Details: Slow, but steady with RW  Stairs            Wheelchair Mobility    Modified Rankin (Stroke Patients Only)       Balance Overall balance assessment: Needs assistance   Sitting balance-Leahy Scale: Good     Standing balance support: No upper extremity supported;Single extremity supported;Bilateral upper extremity supported;During functional activity Standing balance-Leahy Scale: Fair                               Pertinent Vitals/Pain Pain Assessment: 0-10 Pain Score: 5  Pain Location: Abdominal pain; some R knee OA pain Pain Descriptors / Indicators: Aching Pain Intervention(s): Monitored during session;Premedicated before session    Home Living Family/patient expects to be discharged to:: Private residence Living Arrangements: Alone Available Help at Discharge: Family;Available PRN/intermittently Type of Home: House Home Access: Stairs to enter Entrance Stairs-Rails: Right Entrance Stairs-Number of Steps: 5 Home Layout: Multi-level;Laundry or work area in Risk analystbasement;Bed/bath upstairs Home Equipment: Information systems managerhower seat - built in      Prior Function Level of Independence: Independent         Comments: Was in charge of a [employer]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     Hand Dominance        Extremity/Trunk Assessment   Upper Extremity Assessment Upper Extremity Assessment: Overall WFL for tasks assessed(for simple tasks)    Lower Extremity Assessment Lower Extremity Assessment:  Generalized weakness       Communication   Communication: No difficulties  Cognition Arousal/Alertness: Awake/alert Behavior During Therapy: WFL for tasks assessed/performed Overall Cognitive Status: Within Functional Limits for tasks assessed                                         General Comments      Exercises     Assessment/Plan    PT Assessment Patient needs continued PT services  PT Problem List Decreased strength;Decreased range of motion;Decreased activity tolerance;Decreased balance;Decreased mobility;Decreased knowledge of use of DME;Decreased knowledge of precautions;Pain       PT Treatment Interventions DME instruction;Gait training;Stair training;Functional mobility training;Therapeutic activities;Therapeutic exercise;Patient/family education    PT Goals (Current goals can be found in the Care Plan section)  Acute Rehab PT Goals Patient Stated Goal: To get better and be well enough tot go home PT Goal Formulation: With patient Time For Goal Achievement: 03/13/18 Potential to Achieve Goals: Good    Frequency Min 3X/week   Barriers to discharge Decreased caregiver support Must be modified independent to dc home    Co-evaluation               AM-PAC PT "6 Clicks" Mobility  Outcome Measure Help needed turning from your back to your side while in a flat bed without using bedrails?: None Help needed moving from lying on your back to sitting on the side of a flat bed without using bedrails?: A Little Help needed moving to and from a bed to a chair (including a wheelchair)?: A Little Help needed standing up from a chair using your arms (e.g., wheelchair or bedside chair)?: A Little Help needed to walk in hospital room?: None Help needed climbing 3-5 steps with a railing? : A Little 6 Click Score: 20    End of Session   Activity Tolerance: Patient tolerated treatment well Patient left: in chair;with call bell/phone within reach;with family/visitor present Nurse Communication: Mobility status PT Visit Diagnosis: Unsteadiness on feet (R26.81);Other abnormalities of gait and mobility (R26.89);Pain Pain - part of body: (Abdominal pain)    Time: 0957-1034(minus approx 5 minutes while pt in bathroom) PT  Time Calculation (min) (ACUTE ONLY): 37 min   Charges:   PT Evaluation $PT Eval Low Complexity: 1 Low PT Treatments $Gait Training: 8-22 mins        Van ClinesHolly Jazaria Jarecki, PT  Acute Rehabilitation Services Pager [phone] Office [phone]   Levi AlandHolly H Modell Fendrick 02/27/2018, 12:48 PM

## 2018-02-27 NOTE — Progress Notes (Signed)
Nutrition Follow-up  DOCUMENTATION CODES:   Obesity unspecified  INTERVENTION:   -Ensure Enlive po BID, each supplement provides 350 kcal and 20 grams of protein -MVI with minerals daily  NUTRITION DIAGNOSIS:   Inadequate oral intake related to acute illness(gallstone ileus s/p ex lap ) as evidenced by meal completion < 50%.  Ongoing  GOAL:   Patient will meet greater than or equal to 90% of their needs  Progressing  MONITOR:   PO intake, Supplement acceptance, Diet advancement, Labs, Weight trends, Skin, I & O's  REASON FOR ASSESSMENT:   Malnutrition Screening Tool    ASSESSMENT:   67 yr old female with PMHx significant for Acid reflux, HTN, Nephrolithiasis, Osteopenia presented on 12/22 with nausea and vomiting. 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 Pt s/p ex lap with gallstone removal    12/25- NGT removed, advanced to clear liquid diet 12/26- advanced to full liquids  Spoke with pt, who reports feeing better today. Her appetite is improving and she tolerated clear liquids without difficulty this morning (consumed chicken broth and jello). Lunch tray was just delivered at time of visit; this RD helped set pt up for lunch. Discussed importance of good meal and supplement intake to promote healing.  Labs reviewed: CBGS: 116-158 (inpatient orders for glycemic control are 1-3 units insulin aspaert QID).  Diet Order:   Diet Order            Diet full liquid Room service appropriate? Yes; Fluid consistency: Thin  Diet effective now              EDUCATION NEEDS:   Education needs have been addressed  Skin:  Skin Assessment: Skin Integrity Issues: Skin Integrity Issues:: Incisions Incisions: closed abdomen  Last BM:  02/26/18  Height:   Ht Readings from Last 1 Encounters:  02/23/18 5\' 3"  (1.6 m)    Weight:   Wt Readings from Last 1 Encounters:  02/25/18  106.2 kg    Ideal Body Weight:  52.3 kg  BMI:  Body mass index is 41.47 kg/m.  Estimated Nutritional Needs:   Kcal:  1800-2100kcal/day   Protein:  91-109g/day   Fluid:  >1.5L/day     Emonnie Cannady A. Mayford KnifeWilliams, RD, LDN, CDE Pager: [phone] After hours Pager: [phone]

## 2018-02-27 NOTE — Progress Notes (Signed)
3 Days Post-Op  Subjective: Tolerated clear liquid diet.  Had a bowel movement.  Pain controlled.  Still sore.  Ambulating some.  Voiding well.  Lab work today shows WBC 4700.  Hemoglobin 10.7.  Potassium 3.7.  Creatinine 0.77  Wonders about chronic cough.   Objective: Vital signs in last 24 hours: Temp:  [98.4 F (36.9 C)-98.8 F (37.1 C)] 98.4 F (36.9 C) (12/26 0534) Pulse Rate:  [62-67] 62 (12/26 0534) Resp:  [18-20] 18 (12/26 0534) BP: (125-127)/(56-68) 125/63 (12/26 0534) SpO2:  [99 %-100 %] 99 % (12/26 0534) Last BM Date: 02/26/18  Intake/Output from previous day: 12/25 0701 - 12/26 0700 In: 1966.5 [I.V.:1470.5; IV Piggyback:496] Out: 800 [Urine:600; Emesis/NG output:200] Intake/Output this shift: Total I/O In: 650 [I.V.:550; IV Piggyback:100] Out: 600 [Urine:600]  PE: General appearance: Alert.  Mental status normal.  No distress Resp: Clear to auscultation GI: Soft.  Nondistended.  Wound clean.  Hypoactive bowel sounds Extremities: No cyanosis.  No edema.  No tenderness   Lab Results:  Results for orders placed or performed during the hospital encounter of 02/23/18 (from the past 24 hour(s))  Glucose, capillary     Status: Abnormal   Collection Time: 02/26/18  8:22 AM  Result Value Ref Range   Glucose-Capillary 68 (L) 70 - 99 mg/dL   Comment 1 Notify RN   Glucose, capillary     Status: None   Collection Time: 02/26/18  8:50 AM  Result Value Ref Range   Glucose-Capillary 78 70 - 99 mg/dL   Comment 1 Notify RN   Glucose, capillary     Status: Abnormal   Collection Time: 02/26/18 12:06 PM  Result Value Ref Range   Glucose-Capillary 149 (H) 70 - 99 mg/dL   Comment 1 Notify RN   Glucose, capillary     Status: Abnormal   Collection Time: 02/26/18  5:20 PM  Result Value Ref Range   Glucose-Capillary 145 (H) 70 - 99 mg/dL   Comment 1 Notify RN   Glucose, capillary     Status: Abnormal   Collection Time: 02/26/18  9:04 PM  Result Value Ref Range    Glucose-Capillary 158 (H) 70 - 99 mg/dL   Comment 1 Notify RN    Comment 2 Document in Chart   CBC     Status: Abnormal   Collection Time: 02/27/18  2:49 AM  Result Value Ref Range   WBC 4.7 4.0 - 10.5 K/uL   RBC 3.52 (L) 3.87 - 5.11 MIL/uL   Hemoglobin 10.7 (L) 12.0 - 15.0 g/dL   HCT 16.132.8 (L) 09.636.0 - 04.546.0 %   MCV 93.2 80.0 - 100.0 fL   MCH 30.4 26.0 - 34.0 pg   MCHC 32.6 30.0 - 36.0 g/dL   RDW 40.915.6 (H) 81.111.5 - 91.415.5 %   Platelets 139 (L) 150 - 400 K/uL   nRBC 0.0 0.0 - 0.2 %  Basic metabolic panel     Status: Abnormal   Collection Time: 02/27/18  2:49 AM  Result Value Ref Range   Sodium 141 135 - 145 mmol/L   Potassium 3.7 3.5 - 5.1 mmol/L   Chloride 110 98 - 111 mmol/L   CO2 24 22 - 32 mmol/L   Glucose, Bld 154 (H) 70 - 99 mg/dL   BUN 15 8 - 23 mg/dL   Creatinine, Ser 7.820.77 0.44 - 1.00 mg/dL   Calcium 7.9 (L) 8.9 - 10.3 mg/dL   GFR calc non Af Amer >60 >60 mL/min   GFR  calc Af Amer >60 >60 mL/min   Anion gap 7 5 - 15     Studies/Results: No results found.  . heparin  5,000 Units Subcutaneous Q8H  . insulin aspart  1-3 Units Subcutaneous TID AC & HS  . pantoprazole (PROTONIX) IV  40 mg Intravenous Q12H  . scopolamine  1 patch Transdermal Q72H     Assessment/Plan: s/p Procedure(s): LAPAROSCOPIC ASSISTED/SMALL BOWEL RESECTION convereted to open   POD #3.  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  I discussed the algorithm for this work-up and eventual surgical repair in some detail  Chronic cough.  She discussed this with her PCP.  Portable chest x-ray shows perhaps some right paratracheal thickening. I ordered a 2 view chest x-ray to make sure were not missing anything  Hypokalemia.  Treat with IV therapy. K 3.7 12/26.  GERD,  chronic Hypertension Nephrolithiasis  @PROBHOSP @  LOS: 4 days    Ernestene MentionHaywood M Reata Petrov 02/27/2018  . .prob

## 2018-02-28 LAB — CULTURE, BLOOD (ROUTINE X 2)
Culture: NO GROWTH
Special Requests: ADEQUATE
Special Requests: ADEQUATE

## 2018-02-28 MED ORDER — ACETAMINOPHEN 325 MG PO TABS
650.0000 mg | ORAL_TABLET | ORAL | Status: DC | PRN
  Administered 2018-02-28: 650 mg via ORAL
  Filled 2018-02-28: qty 2

## 2018-02-28 MED ORDER — TRAMADOL HCL 50 MG PO TABS: 50.0000 mg | ORAL_TABLET | Freq: Four times a day (QID) | ORAL | 0 refills | Status: DC | PRN

## 2018-02-28 MED ORDER — AMOXICILLIN-POT CLAVULANATE 875-125 MG PO TABS: 1.0000 | ORAL_TABLET | Freq: Two times a day (BID) | ORAL | 0 refills | Status: DC

## 2018-02-28 MED ORDER — TRAMADOL HCL 50 MG PO TABS
50.0000 mg | ORAL_TABLET | Freq: Four times a day (QID) | ORAL | Status: DC | PRN
  Administered 2018-02-28 – 2018-03-01 (×2): 50 mg via ORAL
  Filled 2018-02-28 (×2): qty 1

## 2018-02-28 MED ORDER — AMOXICILLIN-POT CLAVULANATE 875-125 MG PO TABS
1.0000 | ORAL_TABLET | Freq: Two times a day (BID) | ORAL | Status: DC
  Administered 2018-02-28 – 2018-03-01 (×3): 1 via ORAL
  Filled 2018-02-28 (×3): qty 1

## 2018-02-28 NOTE — Discharge Summary (Addendum)
Physician Discharge Summary  Joanna Good EXB:[id number] DOB: Aug 04, 1950 DOA: 02/23/2018  PCP: Benita StabileHall, John Z, MD  Admit date: 02/23/2018 Discharge date: 03/01/18  Admitted From: Home Disposition:  Home  Discharge Condition:Stable CODE STATUS:FULL Diet recommendation: Heart Healthy   Brief/Interim Summary: Patient is a 67 year old female with past medical history of GERD, Hypertension, nephrolithiasis, osteopenia who presented with abdominal pain, nausea/vomiting, diarrhea.  She was found to be in septic shock on presentation suspected to be from intra-abdominal source.  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  She was admitted under PCCM service.  General surgery consulted.  Patient was started on broad start antibiotics, IV fluids and started on vasopressors She was found to have gallstone eroding through the gallbladder wall and  obstructing the distal bowel with SBO.She underwent Laparotomy and gallstone removal through enterotomy and terminal ileum for gallstone ileus on 02/24/18. Patient is currently hemodynamically stable.  [facility].  Currently blood cultures are showing Klebsiella pneumonia and found to be pansensitive.  Antibiotics has been changed to oral which we will continue for 7 more days to complete a course of 14 days total.  She is tolerating diet.She is stable for discharge to home on 03/01/18.  Following problems were addressed during hospitalization:   Septic shock/Klebsiella bacteremia: Secondary to cholecystoenteric and fistula.  Was initially admitted under PCCM service.  Started on vasopressors on admission.  Currently blood pressure stable.  Off vasopressors.  Blood cultures showing pansenstive Klebsiella pneumoniae.Started on augmentin now.. Plan for total of 14 days of antibiotics from start date.  Cholecysto enteric fistula/SBO/gallstone ileus:She was found to have gallstone eroding through the  gallbladder wall and obstruction of the distal bowel with SBO.   General surgery following.S/P Laparotomy and gallstone removal through enterotomy and terminal ileum for gallstone ileus Postop day 4.  Off NG tube.  tolerating diet diet.  Acute kidney injury: Resolved  Hypokalemia: Supplemented   Hypernatremia: Improved.  Suspected upper GI bleed: Fecal occult blood test positive.  Suspected to be secondary to gallstone  eroding small intestine.  No active bleeding.  GI signed off.  Continue PPI.    Transient A. fib: Currently in normal sinus rhythm.   Peripheral eosinophilia/drug reaction: Suspected to be from Dilaudid.  Resolved.  Mild thrombocytopenia: Monitor as an outpatient.   Discharge Diagnoses:  Active Problems:   Sepsis (HCC)   Shock (HCC)   Gallstone ileus (HCC)   Pneumobilia   Change in bowel habits    Discharge Instructions   Allergies as of 02/28/2018      Reactions   Bee Venom    Claritin [loratadine] Nausea And Vomiting   Ibuprofen    Sulfonamide Derivatives       Medication List    TAKE these medications   amoxicillin-clavulanate 875-125 MG tablet Commonly known as:  AUGMENTIN Take 1 tablet by mouth every 12 (twelve) hours for 7 days. Start taking on:  March 01, 2018   aspirin EC 325 MG tablet Take 1 tablet (325 mg total) by mouth daily.   CALCIUM + D PO Take by mouth.   celecoxib 100 MG capsule Commonly known as:  CELEBREX Take 1 capsule by mouth daily.   escitalopram 5 MG tablet Commonly known as:  LEXAPRO Take 1 tablet by mouth daily.   esomeprazole 20 MG capsule Commonly known as:  NEXIUM Take 20 mg by mouth daily at 12 noon.   ezetimibe 10 MG tablet Commonly known as:  ZETIA Take  10 mg by mouth daily.   FISH OIL PO Take by mouth.   multivitamin tablet Take 1 tablet by mouth daily.   NASACORT AQ 55 MCG/ACT Aero nasal inhaler Generic drug:  triamcinolone Place 2 sprays into the nose daily.   OVER THE COUNTER  MEDICATION   traMADol 50 MG tablet Commonly known as:  ULTRAM Take 1 tablet (50 mg total) by mouth every 6 (six) hours as needed for moderate pain.      Follow-up Information    Benita Stabile, MD. Schedule an appointment as soon as possible for a visit in 1 week(s).   Specialty:  Internal Medicine Contact information: [address] [place] [address] [phone]          Allergies  Allergen Reactions  . Bee Venom   . Claritin [Loratadine] Nausea And Vomiting  . Ibuprofen   . Sulfonamide Derivatives     Consultations:  General surgery   Procedures/Studies: Ct Abdomen Pelvis Wo Contrast  Result Date: 02/23/2018 CLINICAL DATA:  Abdominal distension. Coffee-ground emesis. Concern for MS enteric ischemia ruptured aneurysm or perforation. Delayed stool. EXAM: CT ABDOMEN AND PELVIS WITHOUT CONTRAST TECHNIQUE: Multidetector CT imaging of the abdomen and pelvis was performed following the standard protocol without IV contrast. COMPARISON:  None FINDINGS: Lower chest: Lung bases are clear. Hepatobiliary: Small amount pneumobilia in the nondependent LEFT hepatic lobe. Gas is present within the common hepatic duct/common bile duct. There is a poorly defined gallbladder. There is gas within the expected location of the gallbladder fossa and enhancing tissue (image 26/2). This is favored to represent a collapsed gallbladder with intraluminal gas. No significant free fluid.  No intraperitoneal free air. Pancreas: Pancreas is normal. No ductal dilatation. No pancreatic inflammation. Spleen: Normal spleen Adrenals/urinary tract: Adrenal glands and kidneys are normal. The ureters and bladder normal. Stomach/Bowel: The stomach and duodenum are normal. There is dilatation of the proximal small bowel up to 4 cm best seen on the CT topogram. The dilated small bowel is fluid and gas filled and extends over a long segment of small bowel into the RIGHT lower quadrant. Within loop of bowel in the  RIGHT lower quadrant there is a round 2.2 cm lesion with peripheral calcification in central low-attenuation. This is favored a gallstone which has eroded into the bowel and migrated distally. Proximal to this presumed gallstone the small bowel is obstructed. No intraperitoneal free air. Vascular/Lymphatic: Abdominal aorta is normal caliber. No periportal or retroperitoneal adenopathy. No pelvic adenopathy. Reproductive: Uterus normal Other: Small amount high-density free fluid in the posterior cul-de-sac. Musculoskeletal: No aggressive osseous lesion. IMPRESSION: 1. Findings consistent with "gallstone ileus". A gallstone has eroded from the gallbladder into the proximal small bowel traveling distally and now OBSTRUCTS the distal small bowel. No pneumatosis or portal venous gas. No perforation. 2. Gallbladder is collapsed with small amount of internal gas as well as pneumobilia consistent with cholecysto-enteric fistula 3. Small amount of high-density fluid in the posterior cul-de-sac presumably related to gallstone eroding through the gallbladder wall. 4. Recommend surgical consultation for the small bowel obstruction. Findings conveyed toANKIT NANAVATI on 02/23/2018  at13:04. Electronically Signed   By: Genevive Bi M.D.   On: 02/23/2018 13:10   Dg Chest 2 View  Result Date: 02/27/2018 CLINICAL DATA:  67 year old female postoperative day 3 from laparotomy for treatment of gallstone ileus (surgical removal of 2 obstructing gallstones from the ileum). Cough. EXAM: CHEST - 2 VIEW COMPARISON:  02/24/2018 and earlier. FINDINGS: Semi upright AP  and lateral views of the chest. Small or trace pleural effusion(s) suspected on the lateral view. Stable lung volumes and mediastinal contours. No pneumothorax, pulmonary edema or consolidation. Negative visible bowel gas pattern in the upper abdomen. No acute osseous abnormality identified. IMPRESSION: Small or trace pleural effusion(s) suspected, but no other acute  cardiopulmonary abnormality. Electronically Signed   By: Odessa Fleming M.D.   On: 02/27/2018 08:14   Dg Chest Portable 1 View  Result Date: 02/23/2018 CLINICAL DATA:  Nasogastric tube placement EXAM: PORTABLE CHEST 1 VIEW COMPARISON:  Portable exam at 1550 hrs compared to earlier study of 02/23/2018 FINDINGS: Nasogastric tube extends into stomach. Enlargement of cardiac silhouette. Mediastinal contour stable. LEFT jugular line tip projects over SVC. Slight chronic accentuation of pulmonary markings in the mid to lower lungs. No segmental consolidation, pleural effusion or pneumothorax. IMPRESSION: Nasogastric tube extends into stomach. Otherwise no change. Electronically Signed   By: Ulyses Southward M.D.   On: 02/23/2018 16:07   Dg Chest Portable 1 View  Result Date: 02/23/2018 CLINICAL DATA:  Central line placement EXAM: PORTABLE CHEST 1 VIEW COMPARISON:  02/23/2018 FINDINGS: Left jugular central venous catheter tip in the SVC. No pneumothorax. Heart size upper normal. Pulmonary vascular congestion unchanged. Negative for effusion Right paratracheal soft tissue thickening could represent venous engorgement or adenopathy. Follow-up two-view chest x-ray suggested. IMPRESSION: Central venous catheter tip in the SVC.  No pneumothorax Mild vascular congestion Right paratracheal soft tissue thickening could represent adenopathy. Recommend two-view chest x-ray. Electronically Signed   By: Marlan Palau M.D.   On: 02/23/2018 14:44   Dg Chest Portable 1 View  Result Date: 02/23/2018 CLINICAL DATA:  Coffee ground emesis 3 days. EXAM: PORTABLE CHEST 1 VIEW COMPARISON:  None. FINDINGS: Lungs are hypoinflated without focal airspace consolidation or effusion. There is mild hazy prominence of the central pulmonary vessels likely mild vascular congestion. Cardiomediastinal silhouette and remainder of the exam is unremarkable. IMPRESSION: Suggestion of mild vascular congestion. Electronically Signed   By: Elberta Fortis M.D.    On: 02/23/2018 12:58   Dg Abd Portable 1v  Result Date: 02/24/2018 CLINICAL DATA:  Gallstone ileus EXAM: PORTABLE ABDOMEN - 1 VIEW COMPARISON:  CT of the abdomen and pelvis 02/23/2018 FINDINGS: NG tube is in the stomach. The stomach is decompressed. Multiple loops of small bowel remain dilated. There is no free air. Degenerative changes are noted in the spine and hips. IMPRESSION: 1. Persistent dilation of small bowel. 2. Decompression of the stomach following placement of NG tube. Electronically Signed   By: Joanna Roberts M.D.   On: 02/24/2018 06:41       Subjective:  Patient seen and examined the bedside this morning.  Events comfortable.  No nausea, vomiting or abdominal pain.  Tolerating current diet. Discharge Exam: Vitals:   02/27/18 2050 02/28/18 0557  BP: 130/66 138/72  Pulse: 66 65  Resp: 16 18  Temp: 99 F (37.2 C) 98.6 F (37 C)  SpO2: 98% 97%   Vitals:   02/27/18 0534 02/27/18 1343 02/27/18 2050 02/28/18 0557  BP: 125/63 (!) 128/52 130/66 138/72  Pulse: 62 68 66 65  Resp: 18  16 18   Temp: 98.4 F (36.9 C) 98.9 F (37.2 C) 99 F (37.2 C) 98.6 F (37 C)  TempSrc: Oral Oral Oral Oral  SpO2: 99% 99% 98% 97%  Weight:      Height:        General: Pt is alert, awake, not in acute distress Cardiovascular: RRR, S1/S2 +,  no rubs, no gallops Respiratory: CTA bilaterally, no wheezing, no rhonchi Abdominal: Soft, NT, ND, bowel sounds +, clean surgical wound, covered with dressing.  Appropriate tenderness Extremities: no edema, no cyanosis    The results of significant diagnostics from this hospitalization (including imaging, microbiology, ancillary and laboratory) are listed below for reference.     Microbiology: Recent Results (from the past 240 hour(s))  Blood Culture (routine x 2)     Status: Abnormal   Collection Time: 02/23/18 12:56 PM  Result Value Ref Range Status   Specimen Description   Final    BLOOD RIGHT ARM Performed at [facility], [address], [place], [place] [address]    Special Requests   Final    BOTTLES DRAWN AEROBIC AND ANAEROBIC Blood Culture adequate volume Performed at [facility], [address] [place], [place] [address]    Culture  Setup Time   Final    IN BOTH AEROBIC AND ANAEROBIC BOTTLES GRAM NEGATIVE RODS Gram Stain Report Called to,Read Back By and Verified With: J PARRISH,RN @2238  02/24/18 MKELLY CRITICAL RESULT CALLED TO, READ BACK BY AND VERIFIED WITH: Evelena Peat [facility] 02/25/18 0422 JDW Performed at [facility], [address] [place], [place] [address]    Culture KLEBSIELLA PNEUMONIAE (A)  Final   Report Status 02/28/2018 FINAL  Final   Organism ID, Bacteria KLEBSIELLA PNEUMONIAE  Final      Susceptibility   Klebsiella pneumoniae - MIC*    AMPICILLIN >=32 RESISTANT Resistant     CEFAZOLIN <=4 SENSITIVE Sensitive     CEFEPIME <=1 SENSITIVE Sensitive     CEFTAZIDIME <=1 SENSITIVE Sensitive     CEFTRIAXONE <=1 SENSITIVE Sensitive     CIPROFLOXACIN <=0.25 SENSITIVE Sensitive     GENTAMICIN <=1 SENSITIVE Sensitive     IMIPENEM <=0.25 SENSITIVE Sensitive     TRIMETH/SULFA <=20 SENSITIVE Sensitive     AMPICILLIN/SULBACTAM 4 SENSITIVE Sensitive     PIP/TAZO <=4 SENSITIVE Sensitive     Extended ESBL NEGATIVE Sensitive     * KLEBSIELLA PNEUMONIAE  Blood Culture ID Panel (Reflexed)     Status: Abnormal   Collection Time: 02/23/18 12:56 PM  Result Value Ref Range Status   Enterococcus species NOT DETECTED NOT DETECTED Final   Listeria monocytogenes NOT DETECTED NOT DETECTED Final   Staphylococcus species NOT DETECTED NOT DETECTED Final   Staphylococcus aureus (BCID) NOT DETECTED NOT DETECTED Final   Streptococcus species NOT DETECTED NOT DETECTED Final   Streptococcus agalactiae NOT DETECTED NOT DETECTED Final   Streptococcus pneumoniae NOT DETECTED NOT DETECTED Final   Streptococcus pyogenes NOT DETECTED NOT DETECTED Final   Acinetobacter baumannii NOT DETECTED NOT DETECTED  Final   Enterobacteriaceae species DETECTED (A) NOT DETECTED Final    Comment: Enterobacteriaceae represent a large family of gram-negative bacteria, not a single organism. CRITICAL RESULT CALLED TO, READ BACK BY AND VERIFIED WITH: G ABBOTT PHARMD 02/25/18 0422 JDW    Enterobacter cloacae complex NOT DETECTED NOT DETECTED Final   Escherichia coli NOT DETECTED NOT DETECTED Final   Klebsiella oxytoca NOT DETECTED NOT DETECTED Final   Klebsiella pneumoniae DETECTED (A) NOT DETECTED Final    Comment: CRITICAL RESULT CALLED TO, READ BACK BY AND VERIFIED WITH: G ABBOTT PHARMD 02/25/18 0422 JDW    Proteus species NOT DETECTED NOT DETECTED Final   Serratia marcescens NOT DETECTED NOT DETECTED Final   Carbapenem resistance NOT DETECTED NOT DETECTED Final   Haemophilus influenzae NOT DETECTED NOT DETECTED Final   Neisseria  meningitidis NOT DETECTED NOT DETECTED Final   Pseudomonas aeruginosa NOT DETECTED NOT DETECTED Final   Candida albicans NOT DETECTED NOT DETECTED Final   Candida glabrata NOT DETECTED NOT DETECTED Final   Candida krusei NOT DETECTED NOT DETECTED Final   Candida parapsilosis NOT DETECTED NOT DETECTED Final   Candida tropicalis NOT DETECTED NOT DETECTED Final    Comment: Performed at [facility], [address], [place], [place] [address]  Blood Culture (routine x 2)     Status: None   Collection Time: 02/23/18  2:36 PM  Result Value Ref Range Status   Specimen Description BLOOD CENTRAL LINE  Final   Special Requests   Final    BOTTLES DRAWN AEROBIC AND ANAEROBIC Blood Culture adequate volume   Culture   Final    NO GROWTH 5 DAYS Performed at [facility], [address] [place], [place] [address]    Report Status 02/28/2018 FINAL  Final  MRSA PCR Screening     Status: None   Collection Time: 02/23/18  7:01 PM  Result Value Ref Range Status   MRSA by PCR NEGATIVE NEGATIVE Final    Comment:        The GeneXpert MRSA Assay (FDA approved for NASAL  specimens only), is one component of a comprehensive MRSA colonization surveillance program. It is not intended to diagnose MRSA infection nor to guide or monitor treatment for MRSA infections. Performed at [facility], [address], [place], [place] [address]   Culture, Urine     Status: None   Collection Time: 02/24/18  4:38 AM  Result Value Ref Range Status   Specimen Description URINE, CATHETERIZED  Final   Special Requests NONE  Final   Culture   Final    NO GROWTH Performed at [facility], [address], [place], [place] [address]    Report Status 02/25/2018 FINAL  Final     Labs: BNP (last 3 results) No results for input(s): BNP in the last 8760 hours. Basic Metabolic Panel: Recent Labs  Lab 02/23/18 2022 02/24/18 0317 02/25/18 0451 02/26/18 0515 02/27/18 0249  NA 136 137 144 147* 141  K 3.0* 3.8 3.6 2.9* 3.7  CL 102 107 113* 113* 110  CO2 22 23 22 24 24   GLUCOSE 169* 190* 111* 85 154*  BUN 50* 44* 29* 25* 15  CREATININE 3.42* 2.60* 1.30* 1.01* 0.77  CALCIUM 7.9* 8.1* 8.0* 8.1* 7.9*  MG 1.7 2.1  --  2.1  --   PHOS 3.9  --   --   --   --    Liver Function Tests: Recent Labs  Lab 02/23/18 1253 02/23/18 2022 02/24/18 0317 02/25/18 0451 02/26/18 0515  AST 30 30 30 20 18   ALT 30 28 28 25 21   ALKPHOS 49 41 42 36* 39  BILITOT 0.5 1.0 1.0 0.6 0.7  PROT 6.5 5.4* 5.1* 5.1* 4.9*  ALBUMIN 3.3* 2.5* 2.3* 2.2* 2.0*   Recent Labs  Lab 02/23/18 1200  LIPASE 20   No results for input(s): AMMONIA in the last 168 hours. CBC: Recent Labs  Lab 02/23/18 1253  02/23/18 2022 02/24/18 0317 02/24/18 1503 02/25/18 0451 02/26/18 0515 02/27/18 0249  WBC 15.8*  --  12.6* 9.7 6.8 6.4 6.2 4.7  NEUTROABS 13.1*  --  10.6* 6.3  --  5.3 4.3  --   HGB 14.0   < > 12.4 11.4* 10.8* 10.7* 10.7* 10.7*  HCT 43.9   < >  38.0 35.5* 34.4* 33.1* 33.0* 32.8*  MCV 94.8  --  92.5 92.7 95.3 95.9 95.4 93.2  PLT 181  --  173 145* 124* 122* 127* 139*   < > = values  in this interval not displayed.   Cardiac Enzymes: Recent Labs  Lab 02/23/18 1257 02/25/18 0903  TROPONINI 0.06* <0.03   BNP: Invalid input(s): POCBNP CBG: Recent Labs  Lab 02/26/18 2104 02/27/18 0755 02/27/18 1153 02/27/18 1650 02/27/18 2051  GLUCAP 158* 116* 109* 109* 168*   D-Dimer No results for input(s): DDIMER in the last 72 hours. Hgb A1c No results for input(s): HGBA1C in the last 72 hours. Lipid Profile No results for input(s): CHOL, HDL, LDLCALC, TRIG, CHOLHDL, LDLDIRECT in the last 72 hours. Thyroid function studies No results for input(s): TSH, T4TOTAL, T3FREE, THYROIDAB in the last 72 hours.  Invalid input(s): FREET3 Anemia work up No results for input(s): VITAMINB12, FOLATE, FERRITIN, TIBC, IRON, RETICCTPCT in the last 72 hours. Urinalysis    Component Value Date/Time   COLORURINE YELLOW 02/23/2018 2227   APPEARANCEUR TURBID (A) 02/23/2018 2227   LABSPEC 1.025 02/23/2018 2227   PHURINE 5.5 02/23/2018 2227   GLUCOSEU NEGATIVE 02/23/2018 2227   HGBUR MODERATE (A) 02/23/2018 2227   BILIRUBINUR NEGATIVE 02/23/2018 2227   KETONESUR 15 (A) 02/23/2018 2227   PROTEINUR 100 (A) 02/23/2018 2227   UROBILINOGEN 0.2 09/14/2014 1131   NITRITE NEGATIVE 02/23/2018 2227   LEUKOCYTESUR SMALL (A) 02/23/2018 2227   Sepsis Labs Invalid input(s): PROCALCITONIN,  WBC,  LACTICIDVEN Microbiology Recent Results (from the past 240 hour(s))  Blood Culture (routine x 2)     Status: Abnormal   Collection Time: 02/23/18 12:56 PM  Result Value Ref Range Status   Specimen Description   Final    BLOOD RIGHT ARM Performed at [facility], [address] [place], [place] [address]    Special Requests   Final    BOTTLES DRAWN AEROBIC AND ANAEROBIC Blood Culture adequate volume Performed at [facility], [address] [place], [place] [address]    Culture  Setup Time   Final    IN BOTH AEROBIC AND ANAEROBIC BOTTLES GRAM NEGATIVE RODS Gram Stain Report Called to,Read Back  By and Verified With: J PARRISH,RN @2238  02/24/18 MKELLY CRITICAL RESULT CALLED TO, READ BACK BY AND VERIFIED WITH: Evelena PeatG ABBOTT [facility] 02/25/18 0422 JDW Performed at [facility], [address] [place], [place] [address]    Culture KLEBSIELLA PNEUMONIAE (A)  Final   Report Status 02/28/2018 FINAL  Final   Organism ID, Bacteria KLEBSIELLA PNEUMONIAE  Final      Susceptibility   Klebsiella pneumoniae - MIC*    AMPICILLIN >=32 RESISTANT Resistant     CEFAZOLIN <=4 SENSITIVE Sensitive     CEFEPIME <=1 SENSITIVE Sensitive     CEFTAZIDIME <=1 SENSITIVE Sensitive     CEFTRIAXONE <=1 SENSITIVE Sensitive     CIPROFLOXACIN <=0.25 SENSITIVE Sensitive     GENTAMICIN <=1 SENSITIVE Sensitive     IMIPENEM <=0.25 SENSITIVE Sensitive     TRIMETH/SULFA <=20 SENSITIVE Sensitive     AMPICILLIN/SULBACTAM 4 SENSITIVE Sensitive     PIP/TAZO <=4 SENSITIVE Sensitive     Extended ESBL NEGATIVE Sensitive     * KLEBSIELLA PNEUMONIAE  Blood Culture ID Panel (Reflexed)     Status: Abnormal   Collection Time: 02/23/18 12:56 PM  Result Value Ref Range Status   Enterococcus species NOT DETECTED NOT DETECTED Final   Listeria monocytogenes NOT DETECTED NOT DETECTED Final  Staphylococcus species NOT DETECTED NOT DETECTED Final   Staphylococcus aureus (BCID) NOT DETECTED NOT DETECTED Final   Streptococcus species NOT DETECTED NOT DETECTED Final   Streptococcus agalactiae NOT DETECTED NOT DETECTED Final   Streptococcus pneumoniae NOT DETECTED NOT DETECTED Final   Streptococcus pyogenes NOT DETECTED NOT DETECTED Final   Acinetobacter baumannii NOT DETECTED NOT DETECTED Final   Enterobacteriaceae species DETECTED (A) NOT DETECTED Final    Comment: Enterobacteriaceae represent a large family of gram-negative bacteria, not a single organism. CRITICAL RESULT CALLED TO, READ BACK BY AND VERIFIED WITH: G ABBOTT PHARMD 02/25/18 0422 JDW    Enterobacter cloacae complex NOT DETECTED NOT DETECTED Final   Escherichia  coli NOT DETECTED NOT DETECTED Final   Klebsiella oxytoca NOT DETECTED NOT DETECTED Final   Klebsiella pneumoniae DETECTED (A) NOT DETECTED Final    Comment: CRITICAL RESULT CALLED TO, READ BACK BY AND VERIFIED WITH: G ABBOTT PHARMD 02/25/18 0422 JDW    Proteus species NOT DETECTED NOT DETECTED Final   Serratia marcescens NOT DETECTED NOT DETECTED Final   Carbapenem resistance NOT DETECTED NOT DETECTED Final   Haemophilus influenzae NOT DETECTED NOT DETECTED Final   Neisseria meningitidis NOT DETECTED NOT DETECTED Final   Pseudomonas aeruginosa NOT DETECTED NOT DETECTED Final   Candida albicans NOT DETECTED NOT DETECTED Final   Candida glabrata NOT DETECTED NOT DETECTED Final   Candida krusei NOT DETECTED NOT DETECTED Final   Candida parapsilosis NOT DETECTED NOT DETECTED Final   Candida tropicalis NOT DETECTED NOT DETECTED Final    Comment: Performed at [facility], [address] [place], [place] [address]  Blood Culture (routine x 2)     Status: None   Collection Time: 02/23/18  2:36 PM  Result Value Ref Range Status   Specimen Description BLOOD CENTRAL LINE  Final   Special Requests   Final    BOTTLES DRAWN AEROBIC AND ANAEROBIC Blood Culture adequate volume   Culture   Final    NO GROWTH 5 DAYS Performed at [facility], [address] [place], [place] [address]    Report Status 02/28/2018 FINAL  Final  MRSA PCR Screening     Status: None   Collection Time: 02/23/18  7:01 PM  Result Value Ref Range Status   MRSA by PCR NEGATIVE NEGATIVE Final    Comment:        The GeneXpert MRSA Assay (FDA approved for NASAL specimens only), is one component of a comprehensive MRSA colonization surveillance program. It is not intended to diagnose MRSA infection nor to guide or monitor treatment for MRSA infections. Performed at [facility], [address], [place], [place] [address]   Culture, Urine     Status: None   Collection Time: 02/24/18  4:38 AM  Result Value  Ref Range Status   Specimen Description URINE, CATHETERIZED  Final   Special Requests NONE  Final   Culture   Final    NO GROWTH Performed at [facility], [address] [place], [place] [address]    Report Status 02/25/2018 FINAL  Final    Please note: You were cared for by a hospitalist during your hospital stay. Once you are discharged, your primary care physician will handle any further medical issues. Please note that NO REFILLS for any discharge medications will be authorized once you are discharged, as it is imperative that you return to your primary care physician (or establish a relationship with a primary care physician if  you do not have one) for your post hospital discharge needs so that they can reassess your need for medications and monitor your lab values.    Time coordinating discharge: 40 minutes  SIGNED:   Burnadette Pop, MD  [facility] 02/28/2018, 12:00 PM Pager [phone]  If 7PM-7AM, please contact night-coverage [email or web address] Password [email or web address]

## 2018-02-28 NOTE — Progress Notes (Signed)
Physical Therapy Treatment Patient Details Name: Joanna ShutterJanet [name] MRN: [id number] DOB: 1951/03/05 Today's Date: 02/28/2018    History of Present Illness Patient is a 67 year old female with past medical history of GERD: Hypertension, nephrolithiasis, osteopenia who presented with abdominal pain, nausea/vomiting, diarrhea.  She was found to be in septic shock on presentation suspected to be from intra-abdominal source.  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 S/P  Laparotomy and gallstone removal through enterotomy and terminal ileum for gallstone ileus    PT Comments    Pt has d/c orders for tomorrow and is agreeable to stair training this afternoon. Given that pt lives alone and has a split level home with 8 steps to bedroom, PT encouraged pt to have someone stay with her initially on d/c for safety. Pt is currently min guard for transfers from recliner and minA for transfers from toilet, discussed use of 3 in 1 over toilet for added support on power up. Pt requires min guard for ambulation of 200 feet and min A for ascent/descent of 9 steps. Educated pt and friend on proper stair ascent/descent with single rail. D/c plans remain appropriate with initial supervision.     Follow Up Recommendations  Home health PT;Supervision - Intermittent;     Equipment Recommendations  Rolling walker with 5" wheels;3in1 (PT)       Precautions / Restrictions Precautions Precautions: Other (comment) Precaution Comments: Consider log rolling due to abdominal pain    Mobility  Bed Mobility Overal bed mobility: Needs Assistance Bed Mobility: Sit to Supine       Sit to supine: Min guard   General bed mobility comments: min guard for safety  Transfers Overall transfer level: Needs assistance Equipment used: Rolling walker (2 wheeled) Transfers: Sit to/from Stand Sit to Stand: Min guard;Min assist         General transfer comment: Minguard standing from recliner with  armrests; min assist to steady RW standing from toilet  Ambulation/Gait Ambulation/Gait assistance: Min guard Gait Distance (Feet): 200 Feet Assistive device: Rolling walker (2 wheeled) Gait Pattern/deviations: Step-through pattern Gait velocity: slowed Gait velocity interpretation: <1.31 ft/sec, indicative of household ambulator General Gait Details: Slow, but steady with RW, vc for proximity to RW and upright posture   Stairs Stairs: Yes Stairs assistance: Min assist Stair Management: One rail Left;Sideways;Step to pattern Number of Stairs: 9 General stair comments: minA for steadying, vc for keeping upright and leaving room for both feet on step tread   Wheelchair Mobility    Modified Rankin (Stroke Patients Only)       Balance Overall balance assessment: Needs assistance   Sitting balance-Leahy Scale: Good     Standing balance support: No upper extremity supported;Single extremity supported;Bilateral upper extremity supported;During functional activity Standing balance-Leahy Scale: Fair                              Cognition Arousal/Alertness: Awake/alert Behavior During Therapy: WFL for tasks assessed/performed Overall Cognitive Status: Within Functional Limits for tasks assessed                                           General Comments General comments (skin integrity, edema, etc.): Pt friend in room, encouraged friend to help with getting pt into home after d/c tomorrow      Pertinent Vitals/Pain Pain Assessment: 0-10  Pain Score: 6  Pain Location: Abdominal pain; some R knee OA pain Pain Descriptors / Indicators: Aching Pain Intervention(s): Limited activity within patient's tolerance;Monitored during session;Repositioned           PT Goals (current goals can now be found in the care plan section) Acute Rehab PT Goals Patient Stated Goal: To get better and be well enough tot go home PT Goal Formulation: With  patient Time For Goal Achievement: 03/13/18 Potential to Achieve Goals: Good Progress towards PT goals: Progressing toward goals    Frequency    Min 3X/week      PT Plan Current plan remains appropriate       AM-PAC PT "6 Clicks" Mobility   Outcome Measure  Help needed turning from your back to your side while in a flat bed without using bedrails?: None Help needed moving from lying on your back to sitting on the side of a flat bed without using bedrails?: A Little Help needed moving to and from a bed to a chair (including a wheelchair)?: A Little Help needed standing up from a chair using your arms (e.g., wheelchair or bedside chair)?: A Little Help needed to walk in hospital room?: None Help needed climbing 3-5 steps with a railing? : A Little 6 Click Score: 20    End of Session Equipment Utilized During Treatment: Gait belt Activity Tolerance: Patient tolerated treatment well Patient left: in chair;with call bell/phone within reach;with family/visitor present Nurse Communication: Mobility status PT Visit Diagnosis: Unsteadiness on feet (R26.81);Other abnormalities of gait and mobility (R26.89);Pain Pain - part of body: (Abdominal pain)     Time: [id number] PT Time Calculation (min) (ACUTE ONLY): 31 min  Charges:  $Gait Training: 23-37 mins                     Boluwatife Flight B. Beverely RisenVan Fleet PT, DPT Acute Rehabilitation Services Pager [phone] Office [phone]    Elon Alaslizabeth B Van Fleet 02/28/2018, 5:26 PM

## 2018-02-28 NOTE — Care Management Note (Addendum)
Case Management Note  Patient Details  Name: Joanna Good MRN: [id number] Date of Birth: [birth year]  Subjective/Objective:                    Action/Plan:Update patient requesting [facility] for home health PT , [facility].  Discussed discharge planning with patient and visitor at bedside.   Patient requesting walker for home. [facility]. Discussed 3 in 1 , however, patient plans to borrow 3 in 1 from friend present in room.   Patient agreeable to home health. She knows "Nida BoatmanBrad" who works at a home health agency , she believes he works at [facility]. She will confirm before picking an agency. Will check back with patient.  Expected Discharge Date:                  Expected Discharge Plan:  Home w Home Health Services  In-House Referral:     Discharge planning Services  CM Consult  Post Acute Care Choice:  Durable Medical Equipment, Home Health Choice offered to:  Patient  DME Arranged:  Walker rolling DME Agency:  [facility]  HH [facility]  [facility]:     Status of Service:  In process, will continue to follow  If discussed at Long Length of Stay Meetings, dates discussed:    Additional Comments:  Kingsley PlanWile, Atanacio Melnyk Marie, RN 02/28/2018, 2:50 PM

## 2018-02-28 NOTE — Care Management (Signed)
Patient has decided she does need 3 in 1 for home. Same ordered. Called Betsy with [facility] she will bring 3 in 1 to patient's room.  Ronny FlurryHeather Aiden Helzer RN BSN [phone]

## 2018-02-28 NOTE — Progress Notes (Signed)
4 Days Post-Op   Subjective/Chief Complaint: Several Bm yesterday Tolerating full liquids Still using occasional IV pain meds Ambulating   Objective: Vital signs in last 24 hours: Temp:  [98.6 F (37 C)-99 F (37.2 C)] 98.6 F (37 C) (12/27 0557) Pulse Rate:  [65-68] 65 (12/27 0557) Resp:  [16-18] 18 (12/27 0557) BP: (128-138)/(52-72) 138/72 (12/27 0557) SpO2:  [97 %-99 %] 97 % (12/27 0557) Last BM Date: 02/27/18  Intake/Output from previous day: 12/26 0701 - 12/27 0700 In: 146.3 [I.V.:64.3; IV Piggyback:82] Out: 300 [Urine:300] Intake/Output this shift: No intake/output data recorded.  General appearance: alert, cooperative and no distress Resp: clear to auscultation bilaterally Cardio: regular rate and rhythm, S1, S2 normal, no murmur, click, rub or gallop GI: soft, incisional tenderness; + BS Incision - some serous drainage on dressing; no erythema  Lab Results:  Recent Labs    02/26/18 0515 02/27/18 0249  WBC 6.2 4.7  HGB 10.7* 10.7*  HCT 33.0* 32.8*  PLT 127* 139*   BMET Recent Labs    02/26/18 0515 02/27/18 0249  NA 147* 141  K 2.9* 3.7  CL 113* 110  CO2 24 24  GLUCOSE 85 154*  BUN 25* 15  CREATININE 1.01* 0.77  CALCIUM 8.1* 7.9*   PT/INR No results for input(s): LABPROT, INR in the last 72 hours. ABG No results for input(s): PHART, HCO3 in the last 72 hours.  Invalid input(s): PCO2, PO2  Studies/Results: Dg Chest 2 View  Result Date: 02/27/2018 CLINICAL DATA:  67 year old female postoperative day 3 from laparotomy for treatment of gallstone ileus (surgical removal of 2 obstructing gallstones from the ileum). Cough. EXAM: CHEST - 2 VIEW COMPARISON:  02/24/2018 and earlier. FINDINGS: Semi upright AP and lateral views of the chest. Small or trace pleural effusion(s) suspected on the lateral view. Stable lung volumes and mediastinal contours. No pneumothorax, pulmonary edema or consolidation. Negative visible bowel gas pattern in the upper  abdomen. No acute osseous abnormality identified. IMPRESSION: Small or trace pleural effusion(s) suspected, but no other acute cardiopulmonary abnormality. Electronically Signed   By: Odessa FlemingH  Hall M.D.   On: 02/27/2018 08:14    Anti-infectives: Anti-infectives (From admission, onward)   Start     Dose/Rate Route Frequency Ordered Stop   02/28/18 1400  ceFAZolin (ANCEF) IVPB 2g/100 mL premix  Status:  Discontinued     2 g 200 mL/hr over 30 Minutes Intravenous Every 8 hours 02/27/18 1225 02/28/18 0916   02/28/18 1000  amoxicillin-clavulanate (AUGMENTIN) 875-125 MG per tablet 1 tablet     1 tablet Oral Every 12 hours 02/28/18 0916     02/27/18 1130  cefTRIAXone (ROCEPHIN) 2 g in sodium chloride 0.9 % 100 mL IVPB  Status:  Discontinued     2 g 200 mL/hr over 30 Minutes Intravenous Every 24 hours 02/27/18 1104 02/27/18 1225   02/25/18 1000  ceFEPIme (MAXIPIME) 1 g in sodium chloride 0.9 % 100 mL IVPB  Status:  Discontinued     1 g 200 mL/hr over 30 Minutes Intravenous Every 12 hours 02/25/18 0820 02/27/18 1104   02/24/18 1400  ceFEPIme (MAXIPIME) 1 g in sodium chloride 0.9 % 100 mL IVPB  Status:  Discontinued     1 g 200 mL/hr over 30 Minutes Intravenous Every 24 hours 02/23/18 2022 02/25/18 0820   02/24/18 0000  metroNIDAZOLE (FLAGYL) IVPB 500 mg  Status:  Discontinued    Note to Pharmacy:  cholecysto-enteric fistula and SBO  In septic shock   500 mg 100  mL/hr over 60 Minutes Intravenous Every 8 hours 02/23/18 2003 02/25/18 0822   02/23/18 1300  ceFEPIme (MAXIPIME) 1 g in sodium chloride 0.9 % 100 mL IVPB     1 g 200 mL/hr over 30 Minutes Intravenous  Once 02/23/18 1257 02/23/18 1454   02/23/18 1300  metroNIDAZOLE (FLAGYL) IVPB 500 mg     500 mg 100 mL/hr over 60 Minutes Intravenous  Once 02/23/18 1257 02/23/18 1705      Assessment/Plan: s/p Procedure(s): LAPAROSCOPIC ASSISTED/SMALL BOWEL RESECTION convereted to open  (N/A) POD #4. 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  I discussed the algorithm for this work-up and eventual surgical repair in some detail  Chronic cough.  She discussed this with her PCP. 2 view CXR unremarkable   GERD, chronic Hypertension Nephrolithiasis   Probably ready for discharge tomorrow.  LOS: 5 days    Wynona LunaMatthew K August Longest 02/28/2018

## 2018-03-01 MED ORDER — AMOXICILLIN-POT CLAVULANATE 875-125 MG PO TABS: 1.0000 | ORAL_TABLET | Freq: Two times a day (BID) | ORAL | 0 refills | Status: AC

## 2018-03-01 NOTE — Discharge Instructions (Signed)
[facility]      [facility] [phone]  OPEN ABDOMINAL SURGERY: POST OP INSTRUCTIONS  Always review your discharge instruction sheet given to you by the facility where your surgery was performed.  IF YOU HAVE DISABILITY OR FAMILY LEAVE FORMS, YOU MUST BRING THEM TO THE OFFICE FOR PROCESSING.  PLEASE DO NOT GIVE THEM TO YOUR DOCTOR.  1. A prescription for pain medication may be given to you upon discharge.  Take your pain medication as prescribed, if needed.  If narcotic pain medicine is not needed, then you may take acetaminophen (Tylenol) or ibuprofen (Advil) as needed. 2. Take your usually prescribed medications unless otherwise directed. 3. If you need a refill on your pain medication, please contact your pharmacy. They will contact our office to request authorization.  Prescriptions will not be filled after 5pm or on week-ends. 4. You should follow a light diet the first few days after arrival home, such as soup and crackers, pudding, etc.unless your doctor has advised otherwise. A high-fiber, low fat diet can be resumed as tolerated.   Be sure to include lots of fluids daily. Most patients will experience some swelling and bruising on the chest and neck area.  Ice packs will help.  Swelling and bruising can take several days to resolve 5. Most patients will experience some swelling and bruising in the area of the incision. Ice pack will help. Swelling and bruising can take several days to resolve..  6. It is common to experience some constipation if taking pain medication after surgery.  Increasing fluid intake and taking a stool softener will usually help or prevent this problem from occurring.  A mild laxative (Milk of Magnesia or Miralax) should be taken according to package directions if there are no bowel movements after 48 hours. 7.  You may have steri-strips (small skin tapes) in place directly over the incision.  These strips should be left on the skin for 7-10 days.  If your  surgeon used skin glue on the incision, you may shower in 24 hours.  The glue will flake off over the next 2-3 weeks.  Any sutures or staples will be removed at the office during your follow-up visit. You may find that a light gauze bandage over your incision may keep your staples from being rubbed or pulled. You may shower and replace the bandage daily. 8. ACTIVITIES:  You may resume regular (light) daily activities beginning the next day--such as daily self-care, walking, climbing stairs--gradually increasing activities as tolerated.  You may have sexual intercourse when it is comfortable.  Refrain from any heavy lifting or straining until approved by your doctor. a. You may drive when you no longer are taking prescription pain medication, you can comfortably wear a seatbelt, and you can safely maneuver your car and apply brakes b. Return to Work: ___________________________________ 9. You should see your doctor in the office for a follow-up appointment approximately two weeks after your surgery.  Make sure that you call for this appointment within a day or two after you arrive home to insure a convenient appointment time. OTHER INSTRUCTIONS:  _____________________________________________________________ _____________________________________________________________  WHEN TO CALL YOUR DOCTOR: 1. Fever over 101.0 2. Inability to urinate 3. Nausea and/or vomiting 4. Extreme swelling or bruising 5. Continued bleeding from incision. 6. Increased pain, redness, or drainage from the incision. 7. Difficulty swallowing or breathing 8. Muscle cramping or spasms. 9. Numbness or tingling in hands or feet or around lips.  The clinic staff is available to   answer your questions during regular business hours.  Please don't hesitate to call and ask to speak to one of the nurses if you have concerns.  For further questions, please visit [email or web address]   

## 2018-03-01 NOTE — Progress Notes (Signed)
Joanna ShutterJanet [name] to be D/C'd  per MD order. Discussed with the patient and all questions fully answered.  VSS, Skin clean, dry and intact without evidence of skin break down, no evidence of skin tears noted.  IV catheter discontinued intact. Site without signs and symptoms of complications. Dressing and pressure applied.  An After Visit Summary was printed and given to the patient. Patient informed where to pickup prescription.  D/c education completed with patient/family including follow up instructions, medication list, d/c activities limitations if indicated, with other d/c instructions as indicated by MD - patient able to verbalize understanding, all questions fully answered.   Patient instructed to return to ED, call 911, or call MD for any changes in condition.   Patient to be escorted via WC, and D/C home via private auto.

## 2018-03-01 NOTE — Progress Notes (Addendum)
5 Days Post-Op   Subjective/Chief Complaint: Having bowel function, tol diet, drainage from wound  Objective: Vital signs in last 24 hours: Temp:  [98 F (36.7 C)-98.6 F (37 C)] 98 F (36.7 C) (12/28 0513) Pulse Rate:  [76-84] 76 (12/28 0513) Resp:  [17] 17 (12/28 0513) BP: (103-116)/(44-67) 103/59 (12/28 0513) SpO2:  [96 %-99 %] 96 % (12/28 0513) Last BM Date: 02/27/18  Intake/Output from previous day: 12/27 0701 - 12/28 0700 In: 240 [P.O.:240] Out: -  Intake/Output this shift: No intake/output data recorded.  General appearance: nad Resp: clear to auscultation bilaterally Cardio: regular rate and rhythm GI: soft, incisional tenderness; + BS Incision - drainage on dressing murky   Lab Results:  Recent Labs    02/27/18 0249  WBC 4.7  HGB 10.7*  HCT 32.8*  PLT 139*   BMET Recent Labs    02/27/18 0249  NA 141  K 3.7  CL 110  CO2 24  GLUCOSE 154*  BUN 15  CREATININE 0.77  CALCIUM 7.9*   PT/INR No results for input(s): LABPROT, INR in the last 72 hours. ABG No results for input(s): PHART, HCO3 in the last 72 hours.  Invalid input(s): PCO2, PO2  Studies/Results: No results found.  Anti-infectives: Anti-infectives (From admission, onward)   Start     Dose/Rate Route Frequency Ordered Stop   03/01/18 0000  amoxicillin-clavulanate (AUGMENTIN) 875-125 MG tablet  Status:  Discontinued     1 tablet Oral Every 12 hours 02/28/18 1200 02/28/18    03/01/18 0000  amoxicillin-clavulanate (AUGMENTIN) 875-125 MG tablet     1 tablet Oral Every 12 hours 02/28/18 1319 03/09/18 2359   02/28/18 1400  ceFAZolin (ANCEF) IVPB 2g/100 mL premix  Status:  Discontinued     2 g 200 mL/hr over 30 Minutes Intravenous Every 8 hours 02/27/18 1225 02/28/18 0916   02/28/18 1000  amoxicillin-clavulanate (AUGMENTIN) 875-125 MG per tablet 1 tablet     1 tablet Oral Every 12 hours 02/28/18 0916     02/27/18 1130  cefTRIAXone (ROCEPHIN) 2 g in sodium chloride 0.9 % 100 mL IVPB   Status:  Discontinued     2 g 200 mL/hr over 30 Minutes Intravenous Every 24 hours 02/27/18 1104 02/27/18 1225   02/25/18 1000  ceFEPIme (MAXIPIME) 1 g in sodium chloride 0.9 % 100 mL IVPB  Status:  Discontinued     1 g 200 mL/hr over 30 Minutes Intravenous Every 12 hours 02/25/18 0820 02/27/18 1104   02/24/18 1400  ceFEPIme (MAXIPIME) 1 g in sodium chloride 0.9 % 100 mL IVPB  Status:  Discontinued     1 g 200 mL/hr over 30 Minutes Intravenous Every 24 hours 02/23/18 2022 02/25/18 0820   02/24/18 0000  metroNIDAZOLE (FLAGYL) IVPB 500 mg  Status:  Discontinued    Note to Pharmacy:  cholecysto-enteric fistula and SBO  In septic shock   500 mg 100 mL/hr over 60 Minutes Intravenous Every 8 hours 02/23/18 2003 02/25/18 0822   02/23/18 1300  ceFEPIme (MAXIPIME) 1 g in sodium chloride 0.9 % 100 mL IVPB     1 g 200 mL/hr over 30 Minutes Intravenous  Once 02/23/18 1257 02/23/18 1454   02/23/18 1300  metroNIDAZOLE (FLAGYL) IVPB 500 mg     500 mg 100 mL/hr over 60 Minutes Intravenous  Once 02/23/18 1257 02/23/18 1705      Assessment/Plan: POD #5. 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 Chronic cough. She discussed this with her PCP. 2 view CXR unremarkable GERD, chronic Hypertension Nephrolithiasis dc today after opening wound Emelia LoronMatthew Christopherjame Carnell 03/01/2018    Addendum: can dc home today, abx and pain rx written for, will have her follow up one week, needs twice daily dressing changes at home and have asked case mgt to follow up with that.  Wound just had old blood and serous fluid present

## 2018-03-01 NOTE — Progress Notes (Signed)
Subjective: Patient seen and examined, no new complaints.  General surgery has cleared patient for discharge.  Vitals:   02/28/18 2141 03/01/18 0513  BP: 116/67 (!) 103/59  Pulse: 84 76  Resp: 17 17  Temp: 98.6 F (37 C) 98 F (36.7 C)  SpO2: 98% 96%   On examination Lower extremities-1+ pitting edema bilaterally Abdomen-soft, nontender, no organomegaly Lungs-clear to auscultation bilaterally   A/P Septic shock/Klebsiella bacteremia Cholecysto enteric fistula/gallstone ileus Acute kidney injury Suspected GI bleed  Patient has been discharged, see detailed discharge summary per Dr. Renford DillsAdhikari. I have recommended patient to keep legs elevated.  She does not need Lasix at this time.  Lower extremity edema likely from hypoalbuminemia.  Albumin is 2.0   Meredeth IdeGagan S Jebadiah Imperato [facility] [phone]- [phone]

## 2018-05-07 ENCOUNTER — Other Ambulatory Visit: Payer: Self-pay

## 2018-05-07 NOTE — Patient Outreach (Signed)
[facility]) Care Management  05/07/2018  NAKIRAH SILVERSTONE [birth year] [id number]   Medication Adherence call to Mrs. Shaqueta Coder Telephone call to Patient regarding Medication Adherence unable to reach patient. Mrs. Kerscher is due on  Livalo 25 mg under [employer] Care Ins.   Lillia Abed CPhT Pharmacy Technician [facility] Care Management Direct Dial [phone]  Fax [phone] [email or web address]

## 2018-06-19 ENCOUNTER — Other Ambulatory Visit: Payer: Self-pay

## 2018-06-19 NOTE — Patient Outreach (Signed)
[facility]) Care Management  06/19/2018  Joanna Good 1950/03/21 [id number]   Medication Adherence call to [name] spoke with patient she is due on Livalo 2 mg she explain she already pick up beginning of the month but is interested on patients assistance.kevin rph will be helping patient for father assistance. [name] is showing past due under [facility] Ins.   Lillia Abed CPhT Pharmacy Technician [facility] Care Management Direct Dial [phone]  Fax [phone] [email or web address]

## 2018-07-10 ENCOUNTER — Other Ambulatory Visit: Payer: Self-pay | Admitting: Pharmacist

## 2018-07-10 NOTE — Patient Outreach (Signed)
[facility]) Care Management  07/10/2018  Joanna Good [birth year] [id number]  Referral received from pharmacy technician [facility], patient had requested a call regarding price of Livalo (pitavastatin).    Successful phone outreach to patient, she verified name, explained purpose of call was to discuss a medication patient expressed was expensive. Attempted to verify birth day as second HIPAA identifier with patient, and patient stated "I thought about it and price is ok."  Call ended without exchange of further information.    Plan:  Case not opened.    Tommye Standard, PharmD, [facility] Clinical Pharmacist [facility] [phone]

## 2019-06-10 DIAGNOSIS — E782 Mixed hyperlipidemia: Secondary | ICD-10-CM | POA: Diagnosis not present

## 2019-06-10 DIAGNOSIS — F330 Major depressive disorder, recurrent, mild: Secondary | ICD-10-CM | POA: Diagnosis not present

## 2019-06-10 DIAGNOSIS — Z0000 Encounter for general adult medical examination without abnormal findings: Secondary | ICD-10-CM | POA: Diagnosis not present

## 2019-06-10 DIAGNOSIS — G589 Mononeuropathy, unspecified: Secondary | ICD-10-CM | POA: Diagnosis not present

## 2019-06-10 DIAGNOSIS — F419 Anxiety disorder, unspecified: Secondary | ICD-10-CM | POA: Diagnosis not present

## 2019-06-10 DIAGNOSIS — E785 Hyperlipidemia, unspecified: Secondary | ICD-10-CM | POA: Diagnosis not present

## 2019-06-10 DIAGNOSIS — F331 Major depressive disorder, recurrent, moderate: Secondary | ICD-10-CM | POA: Diagnosis not present

## 2019-06-10 DIAGNOSIS — F39 Unspecified mood [affective] disorder: Secondary | ICD-10-CM | POA: Diagnosis not present

## 2019-06-16 DIAGNOSIS — R945 Abnormal results of liver function studies: Secondary | ICD-10-CM | POA: Diagnosis not present

## 2019-06-16 DIAGNOSIS — Z6841 Body Mass Index (BMI) 40.0 and over, adult: Secondary | ICD-10-CM | POA: Diagnosis not present

## 2019-06-16 DIAGNOSIS — E1169 Type 2 diabetes mellitus with other specified complication: Secondary | ICD-10-CM | POA: Diagnosis not present

## 2019-06-16 DIAGNOSIS — I10 Essential (primary) hypertension: Secondary | ICD-10-CM | POA: Diagnosis not present

## 2019-06-16 DIAGNOSIS — Z8673 Personal history of transient ischemic attack (TIA), and cerebral infarction without residual deficits: Secondary | ICD-10-CM | POA: Diagnosis not present

## 2019-06-16 DIAGNOSIS — M1990 Unspecified osteoarthritis, unspecified site: Secondary | ICD-10-CM | POA: Diagnosis not present

## 2019-06-16 DIAGNOSIS — E782 Mixed hyperlipidemia: Secondary | ICD-10-CM | POA: Diagnosis not present

## 2019-06-16 DIAGNOSIS — F331 Major depressive disorder, recurrent, moderate: Secondary | ICD-10-CM | POA: Diagnosis not present

## 2019-07-28 DIAGNOSIS — F331 Major depressive disorder, recurrent, moderate: Secondary | ICD-10-CM | POA: Diagnosis not present

## 2019-07-28 DIAGNOSIS — Z6841 Body Mass Index (BMI) 40.0 and over, adult: Secondary | ICD-10-CM | POA: Diagnosis not present

## 2019-07-28 DIAGNOSIS — E1169 Type 2 diabetes mellitus with other specified complication: Secondary | ICD-10-CM | POA: Diagnosis not present

## 2019-09-10 DIAGNOSIS — M1712 Unilateral primary osteoarthritis, left knee: Secondary | ICD-10-CM | POA: Diagnosis not present

## 2019-09-10 DIAGNOSIS — M179 Osteoarthritis of knee, unspecified: Secondary | ICD-10-CM | POA: Diagnosis not present

## 2019-09-10 DIAGNOSIS — M170 Bilateral primary osteoarthritis of knee: Secondary | ICD-10-CM | POA: Diagnosis not present

## 2019-11-26 DIAGNOSIS — Z0000 Encounter for general adult medical examination without abnormal findings: Secondary | ICD-10-CM | POA: Diagnosis not present

## 2019-11-26 DIAGNOSIS — R030 Elevated blood-pressure reading, without diagnosis of hypertension: Secondary | ICD-10-CM | POA: Diagnosis not present

## 2019-11-26 DIAGNOSIS — K219 Gastro-esophageal reflux disease without esophagitis: Secondary | ICD-10-CM | POA: Diagnosis not present

## 2019-11-26 DIAGNOSIS — F419 Anxiety disorder, unspecified: Secondary | ICD-10-CM | POA: Diagnosis not present

## 2019-11-26 DIAGNOSIS — M159 Polyosteoarthritis, unspecified: Secondary | ICD-10-CM | POA: Diagnosis not present

## 2019-11-26 DIAGNOSIS — R7303 Prediabetes: Secondary | ICD-10-CM | POA: Diagnosis not present

## 2019-11-26 DIAGNOSIS — R7301 Impaired fasting glucose: Secondary | ICD-10-CM | POA: Diagnosis not present

## 2019-11-26 DIAGNOSIS — Z8673 Personal history of transient ischemic attack (TIA), and cerebral infarction without residual deficits: Secondary | ICD-10-CM | POA: Diagnosis not present

## 2019-11-30 DIAGNOSIS — E1169 Type 2 diabetes mellitus with other specified complication: Secondary | ICD-10-CM | POA: Diagnosis not present

## 2019-11-30 DIAGNOSIS — M1990 Unspecified osteoarthritis, unspecified site: Secondary | ICD-10-CM | POA: Diagnosis not present

## 2019-11-30 DIAGNOSIS — I10 Essential (primary) hypertension: Secondary | ICD-10-CM | POA: Diagnosis not present

## 2019-11-30 DIAGNOSIS — Z0001 Encounter for general adult medical examination with abnormal findings: Secondary | ICD-10-CM | POA: Diagnosis not present

## 2019-11-30 DIAGNOSIS — F331 Major depressive disorder, recurrent, moderate: Secondary | ICD-10-CM | POA: Diagnosis not present

## 2019-11-30 DIAGNOSIS — Z8673 Personal history of transient ischemic attack (TIA), and cerebral infarction without residual deficits: Secondary | ICD-10-CM | POA: Diagnosis not present

## 2019-11-30 DIAGNOSIS — E782 Mixed hyperlipidemia: Secondary | ICD-10-CM | POA: Diagnosis not present

## 2019-11-30 DIAGNOSIS — Z23 Encounter for immunization: Secondary | ICD-10-CM | POA: Diagnosis not present

## 2019-11-30 DIAGNOSIS — R945 Abnormal results of liver function studies: Secondary | ICD-10-CM | POA: Diagnosis not present

## 2020-03-26 DIAGNOSIS — J069 Acute upper respiratory infection, unspecified: Secondary | ICD-10-CM | POA: Diagnosis not present

## 2020-03-26 DIAGNOSIS — U071 COVID-19: Secondary | ICD-10-CM | POA: Diagnosis not present

## 2020-05-07 ENCOUNTER — Emergency Department (HOSPITAL_COMMUNITY)
Admission: EM | Admit: 2020-05-07 | Discharge: 2020-05-07 | Disposition: A | Discharge status: Home / Self Care | Payer: Medicare PPO | Attending: Emergency Medicine | Admitting: Emergency Medicine

## 2020-05-07 ENCOUNTER — Other Ambulatory Visit: Payer: Self-pay

## 2020-05-07 ENCOUNTER — Emergency Department (HOSPITAL_COMMUNITY): Payer: Medicare PPO

## 2020-05-07 ENCOUNTER — Encounter (HOSPITAL_COMMUNITY): Payer: Self-pay | Admitting: *Deleted

## 2020-05-07 DIAGNOSIS — Y9301 Activity, walking, marching and hiking: Secondary | ICD-10-CM | POA: Insufficient documentation

## 2020-05-07 DIAGNOSIS — S0181XA Laceration without foreign body of other part of head, initial encounter: Secondary | ICD-10-CM | POA: Diagnosis not present

## 2020-05-07 DIAGNOSIS — S0003XA Contusion of scalp, initial encounter: Secondary | ICD-10-CM | POA: Diagnosis not present

## 2020-05-07 DIAGNOSIS — G9389 Other specified disorders of brain: Secondary | ICD-10-CM | POA: Diagnosis not present

## 2020-05-07 DIAGNOSIS — I10 Essential (primary) hypertension: Secondary | ICD-10-CM | POA: Diagnosis not present

## 2020-05-07 DIAGNOSIS — S0993XA Unspecified injury of face, initial encounter: Secondary | ICD-10-CM | POA: Diagnosis not present

## 2020-05-07 DIAGNOSIS — W01198A Fall on same level from slipping, tripping and stumbling with subsequent striking against other object, initial encounter: Secondary | ICD-10-CM | POA: Insufficient documentation

## 2020-05-07 DIAGNOSIS — Z79899 Other long term (current) drug therapy: Secondary | ICD-10-CM | POA: Insufficient documentation

## 2020-05-07 DIAGNOSIS — Z7982 Long term (current) use of aspirin: Secondary | ICD-10-CM | POA: Diagnosis not present

## 2020-05-07 DIAGNOSIS — W19XXXA Unspecified fall, initial encounter: Secondary | ICD-10-CM

## 2020-05-07 DIAGNOSIS — M79641 Pain in right hand: Secondary | ICD-10-CM | POA: Insufficient documentation

## 2020-05-07 DIAGNOSIS — M7989 Other specified soft tissue disorders: Secondary | ICD-10-CM | POA: Diagnosis not present

## 2020-05-07 MED ORDER — TETANUS-DIPHTH-ACELL PERTUSSIS 5-2.5-18.5 LF-MCG/0.5 IM SUSY
0.5000 mL | PREFILLED_SYRINGE | Freq: Once | INTRAMUSCULAR | Status: DC
  Filled 2020-05-07: qty 0.5

## 2020-05-07 MED ORDER — LIDOCAINE-EPINEPHRINE (PF) 2 %-1:200000 IJ SOLN
20.0000 mL | Freq: Once | INTRAMUSCULAR | Status: AC
  Administered 2020-05-07: 20 mL via INTRADERMAL
  Filled 2020-05-07: qty 20

## 2020-05-07 NOTE — ED Triage Notes (Signed)
Fell laceration to forehead, over left eye., abrasion under nose, denies LOC. Pain in right hand with swelling

## 2020-05-07 NOTE — ED Provider Notes (Signed)
[facility] EMERGENCY DEPARTMENT Provider Note   CSN: [id number] Arrival date & time: 05/07/20  1558     History Chief Complaint  Patient presents with  . Fall  . Laceration    Joanna Good is a 70 y.o. female.  HPI   70 year old female with history of acid reflux, arthritis, cystocele, hypertension, nephrolithiasis, osteopenia, CVA, uterine prolapse, who presents to the emergency department today for evaluation after a fall.  She states she was walking in her driveway carrying 2 bags of trash and kicking a box when she stumbled over her feet and fell forward hitting her head.  She did not lose consciousness but is complaining of pain to the left forehead and has a laceration present.  She is also complaining of some pain to the right hand.  She denies any neck or back pain.  She denies any bilateral hip pain.  She was able to stand up after the fall and ambulate.  She has not had any episodes of vomiting and there are no other associated neuro complaints or reported injuries.  She states she her Tdap was updated 2 years ago.  Past Medical History:  Diagnosis Date  . Acid reflux   . Arthritis   . Cystocele   . Hypertension   . Kidney stones   . Osteopenia 09/2014   T score -2.4 distal third left radius. Remainder of DEXA normal  . Stroke (HCC)   . Uterine prolapse     Patient Active Problem List   Diagnosis Date Noted  . Change in bowel habits   . Sepsis (HCC) 02/23/2018  . Shock (HCC)   . Gallstone ileus (HCC)   . Pneumobilia   . 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 Kidney stones   . Uterine prolapse   . Cystocele   . HYPERLIPIDEMIA TYPE IIB / III 12/22/2008    Past Surgical History:  Procedure Laterality Date  . COLON RESECTION N/A 02/24/2018   Procedure: LAPAROSCOPIC ASSISTED/SMALL BOWEL RESECTION convereted to open ;  Surgeon: Violeta Gelinas, MD;  Location: [facility] OR;  Service: General;  Laterality: N/A;  . TONSILLECTOMY       OB History    Gravida  2   Para  2    Term  2   Preterm      AB      Living  2     SAB      IAB      Ectopic      Multiple      Live Births              Family History  Problem Relation Age of Onset  . Heart disease Mother   . Hypertension Mother   . Diabetes Father   . Heart disease Father   . Hypertension Father   . Diabetes Sister   . Hypertension Sister   . Breast cancer Maternal Aunt        Age 66  . Breast cancer Paternal Aunt        Age 48    Social History   Tobacco Use  . Smoking status: Never Smoker  . Smokeless tobacco: Never Used  Substance Use Topics  . Alcohol use: Yes    Alcohol/week: 1.0 standard drink    Types: 1 Glasses of wine per week    Comment: Rare  . Drug use: No    Home Medications Prior to Admission medications   Medication Sig Start Date End Date Taking? Authorizing  Provider  aspirin EC 325 MG tablet Take 1 tablet (325 mg total) by mouth daily. 07/17/16  Yes Marvel Plan, MD  Calcium Carbonate-Vitamin D (CALCIUM + D PO) Take by mouth.   Yes [provider]  escitalopram (LEXAPRO) 5 MG tablet Take 1 tablet by mouth daily. 06/14/17  Yes [provider]  esomeprazole (NEXIUM) 20 MG capsule Take 20 mg by mouth daily at 12 noon.   Yes [provider]  ezetimibe (ZETIA) 10 MG tablet Take 10 mg by mouth daily.   Yes [provider]  Multiple Vitamin (MULTIVITAMIN) tablet Take 1 tablet by mouth daily.   Yes [provider]  NEXLETOL 180 MG TABS Take 1 tablet by mouth daily. 04/13/20  Yes [provider]  Omega-3 Fatty Acids (FISH OIL PO) Take by mouth.   Yes [provider]  Turmeric (QC TUMERIC COMPLEX) 500 MG CAPS Take 1 capsule by mouth daily.   Yes [provider]  celecoxib (CELEBREX) 100 MG capsule Take 1 capsule by mouth daily. Patient not taking: Reported on 05/07/2020 07/09/14   [provider]  metFORMIN (GLUCOPHAGE) 500 MG tablet Take 500 mg by mouth daily. 03/07/20   [provider]  OVER THE COUNTER MEDICATION     [provider]  traMADol (ULTRAM) 50 MG tablet Take 1 tablet (50 mg total) by mouth every 6 (six) hours as needed for moderate pain. Patient not taking: No sig reported 02/28/18   Burnadette Pop, MD  triamcinolone (NASACORT) 55 MCG/ACT AERO nasal inhaler Place 2 sprays into the nose daily. Patient not taking: No sig reported    [provider]    Allergies    Bee venom, Claritin [loratadine], Ibuprofen, and Sulfonamide derivatives  Review of Systems   Review of Systems  Constitutional: Negative for fever.  HENT: Negative for ear pain and sore throat.   Eyes: Negative for visual disturbance.  Respiratory: Negative for shortness of breath.   Cardiovascular: Negative for chest pain.  Gastrointestinal: Negative for abdominal pain, nausea and vomiting.  Genitourinary: Negative for flank pain.  Musculoskeletal: Negative for back pain and neck pain.       Right hand pain  Skin: Positive for wound.  Neurological: Negative for weakness and numbness.       Head injury, head pain, no loc  All other systems reviewed and are negative.   Physical Exam Updated Vital Signs BP (!) 147/90   Pulse (!) 107   Temp 98.2 F (36.8 C)   Resp (!) 24   SpO2 100%   Physical Exam Vitals and nursing note reviewed.  Constitutional:      General: She is not in acute distress.    Appearance: She is well-developed and well-nourished.  HENT:     Head: Normocephalic.     Comments: TTP and swelling to the left forehead. 3 cm laceration above the left eyebrow.     Nose:     Comments: No TTP over the nasal bridge. No nasal septal hematoma.  Eyes:     Conjunctiva/sclera: Conjunctivae normal.  Cardiovascular:     Rate and Rhythm: Normal rate and regular rhythm.     Heart sounds: Normal heart sounds. No murmur heard.   Pulmonary:     Effort: Pulmonary effort is normal. No respiratory distress.     Breath sounds: Normal breath sounds.  No wheezing, rhonchi or rales.  Abdominal:     General: Bowel sounds are normal.     Palpations: Abdomen is  soft.     Tenderness: There is no abdominal tenderness.  Musculoskeletal:        General: No edema.     Cervical back: Neck supple.     Comments: No TTP to the bilat hips. No TTP to the CTL spine.   Skin:    General: Skin is warm and dry.  Neurological:     Mental Status: She is alert.     Comments: Mental Status:  Alert, thought content appropriate, able to give a coherent history. Speech fluent without evidence of aphasia. Able to follow 2 step commands without difficulty.  Cranial Nerves II-XII intact Motor:  Normal tone. 5/5 strength of BUE and BLE major muscle groups including strong and equal grip strength and dorsiflexion/plantar flexion Sensory: light touch normal in all extremities.   Psychiatric:        Mood and Affect: Mood and affect normal.     ED Results / Procedures / Treatments   Labs (all labs ordered are listed, but only abnormal results are displayed) Labs Reviewed - No data to display  EKG None  Radiology CT Head Wo Contrast  Result Date: 05/07/2020 CLINICAL DATA:  Fall with forehead laceration. No loss of consciousness. EXAM: CT HEAD WITHOUT CONTRAST TECHNIQUE: Contiguous axial images were obtained from the base of the skull through the vertex without intravenous contrast. COMPARISON:  Head CT 05/14/2017 FINDINGS: Brain: 3 million is normal for age. No intracranial hemorrhage, mass effect, or midline shift. No hydrocephalus. The basilar cisterns are patent. Minimal cortical encephalomalacia involving the right occipital lobe, chronic. No evidence of territorial infarct or acute ischemia. No extra-axial or intracranial fluid collection. Vascular: No hyperdense vessel. Skull: No fracture or focal lesion. Sinuses/Orbits: Assessed on concurrent face CT, reported separately. Other: Left frontal scalp hematoma. IMPRESSION: Left frontal scalp hematoma. No acute  intracranial abnormality. No skull fracture. Electronically Signed   By: Narda Rutherford M.D.   On: 05/07/2020 17:09   DG Hand Complete Right  Result Date: 05/07/2020 CLINICAL DATA:  Fall, RIGHT hand swelling EXAM: RIGHT HAND - COMPLETE 3+ VIEW COMPARISON:  None. FINDINGS: No evidence of fracture of the carpal or metacarpal bones. Radiocarpal joint is intact. Phalanges are normal. No soft tissue injury. IMPRESSION: No fracture or dislocation. Electronically Signed   By: Genevive Bi M.D.   On: 05/07/2020 17:16   CT Maxillofacial Wo Contrast  Result Date: 05/07/2020 CLINICAL DATA:  Facial trauma. Fall with laceration to forehead above left thigh. Abrasion under nose. EXAM: CT MAXILLOFACIAL WITHOUT CONTRAST TECHNIQUE: Multidetector CT imaging of the maxillofacial structures was performed. Multiplanar CT image reconstructions were also generated. COMPARISON:  None. FINDINGS: Osseous: No acute fracture of nasal bones, zygomatic arches, or mandibles. Pterygoid plates are intact. Temporomandibular joints are congruent. Midline nasal septum. Orbits: No orbital fracture or globe injury. Sinuses: No sinus fracture or fluid level. Paranasal sinuses are clear. Mastoid air cells are clear. Soft tissues: Left supraorbital frontal scalp hematoma. Limited intracranial: Assessed on concurrent head CT, reported separately. IMPRESSION: Left supraorbital frontal scalp hematoma. No facial bone fracture. Electronically Signed   By: Narda Rutherford M.D.   On: 05/07/2020 17:12    Procedures .Marland KitchenLaceration Repair  Date/Time: 05/07/2020 6:11 PM Performed by: Karrie Meres, PA-C Authorized by: Karrie Meres, PA-C   Consent:    Consent obtained:  Verbal   Consent given by:  Patient   Risks, benefits, and alternatives were discussed: yes     Risks discussed:  Infection, pain, poor cosmetic result and  retained foreign body   Alternatives discussed:  No treatment Universal protocol:    Procedure explained and  questions answered to patient or proxy's satisfaction: yes     Relevant documents present and verified: yes     Test results available: yes     Imaging studies available: yes     Site/side marked: yes     Patient identity confirmed:  Verbally with patient Anesthesia:    Anesthesia method:  Local infiltration   Local anesthetic:  Lidocaine 2% WITH epi Laceration details:    Location:  Face   Face location:  Forehead   Length (cm):  3 Pre-procedure details:    Preparation:  Patient was prepped and draped in usual sterile fashion and imaging obtained to evaluate for foreign bodies Exploration:    Limited defect created (wound extended): no     Hemostasis achieved with:  Epinephrine and direct pressure   Wound exploration: wound explored through full range of motion and entire depth of wound visualized     Contaminated: no   Treatment:    Area cleansed with:  Saline   Amount of cleaning:  Extensive   Irrigation solution:  Sterile saline   Irrigation volume:  750cc   Irrigation method:  Pressure wash   Visualized foreign bodies/material removed: no     Debridement:  None   Undermining:  None   Scar revision: no   Skin repair:    Repair method:  Sutures   Suture size:  6-0   Suture material:  Prolene   Suture technique:  Simple interrupted   Number of sutures:  8 Approximation:    Approximation:  Close Repair type:    Repair type:  Simple Post-procedure details:    Dressing:  Open (no dressing)   Procedure completion:  Tolerated     Medications Ordered in ED Medications  lidocaine-EPINEPHrine (XYLOCAINE W/EPI) 2 %-1:200000 (PF) injection 20 mL (has no administration in time range)    ED Course  I have reviewed the triage vital signs and the nursing notes.  Pertinent labs & imaging results that were available during my care of the patient were reviewed by me and considered in my medical decision making (see chart for details).    MDM Rules/Calculators/A&P                           70 y/o M presents to the ED today for eval of mechanical fall. She sustained head trauma but did not have LOC. Has lac to the left forehead. Tdap UTD.   Reviewed/interpreted imaging  CT head -  Left frontal scalp hematoma. No acute intracranial abnormality. No skull fracture. CT maxillofacial - Left supraorbital frontal scalp hematoma. No facial bone fracture Xray right hand - no fx or dislocation  Pressure irrigation performed. Wound explored and base of wound visualized in a bloodless field without evidence of foreign body.  Laceration occurred < 8 hours prior to repair which was well tolerated.  Tdap UTD.  Pt has  no comorbidities to effect normal wound healing. Pt discharged without antibiotics.  Discussed suture home care with patient and answered questions. Pt to follow-up for wound check and suture removal in 5 days; they are to return to the ED sooner for signs of infection. Pt is hemodynamically stable with no complaints prior to dc.     Final Clinical Impression(s) / ED Diagnoses Final diagnoses:  Fall, initial encounter  Facial laceration, initial encounter  Rx / DC Orders ED Discharge Orders    None       Karrie MeresCouture, Cortni S, PA-C 05/07/20 1815    Vanetta MuldersZackowski, Scott, MD 05/29/20 1606

## 2020-05-07 NOTE — Discharge Instructions (Signed)
Please follow-up for suture removal at either urgent care ,the emergency department, or your primary care doctor in 5 days. ° °Please return to the emergency room immediately if you experience any new or worsening symptoms or any symptoms that indicate worsening infection such as fevers, increased redness/swelling/pain, warmth, or drainage from the affected area.  ° °

## 2020-05-14 DIAGNOSIS — L089 Local infection of the skin and subcutaneous tissue, unspecified: Secondary | ICD-10-CM | POA: Diagnosis not present

## 2020-05-14 DIAGNOSIS — S0191XD Laceration without foreign body of unspecified part of head, subsequent encounter: Secondary | ICD-10-CM | POA: Diagnosis not present

## 2020-05-19 DIAGNOSIS — L089 Local infection of the skin and subcutaneous tissue, unspecified: Secondary | ICD-10-CM | POA: Diagnosis not present

## 2020-05-19 DIAGNOSIS — Z6836 Body mass index (BMI) 36.0-36.9, adult: Secondary | ICD-10-CM | POA: Diagnosis not present

## 2020-05-19 DIAGNOSIS — R030 Elevated blood-pressure reading, without diagnosis of hypertension: Secondary | ICD-10-CM | POA: Diagnosis not present

## 2020-05-19 DIAGNOSIS — R7303 Prediabetes: Secondary | ICD-10-CM | POA: Diagnosis not present

## 2020-05-19 DIAGNOSIS — N179 Acute kidney failure, unspecified: Secondary | ICD-10-CM | POA: Diagnosis not present

## 2020-05-19 DIAGNOSIS — S0191XD Laceration without foreign body of unspecified part of head, subsequent encounter: Secondary | ICD-10-CM | POA: Diagnosis not present

## 2020-05-19 DIAGNOSIS — E782 Mixed hyperlipidemia: Secondary | ICD-10-CM | POA: Diagnosis not present

## 2020-05-19 DIAGNOSIS — E1169 Type 2 diabetes mellitus with other specified complication: Secondary | ICD-10-CM | POA: Diagnosis not present

## 2020-05-19 DIAGNOSIS — R7301 Impaired fasting glucose: Secondary | ICD-10-CM | POA: Diagnosis not present

## 2020-05-19 DIAGNOSIS — R945 Abnormal results of liver function studies: Secondary | ICD-10-CM | POA: Diagnosis not present

## 2020-05-19 DIAGNOSIS — I10 Essential (primary) hypertension: Secondary | ICD-10-CM | POA: Diagnosis not present

## 2020-05-23 DIAGNOSIS — E1169 Type 2 diabetes mellitus with other specified complication: Secondary | ICD-10-CM | POA: Diagnosis not present

## 2020-05-23 DIAGNOSIS — Z6841 Body Mass Index (BMI) 40.0 and over, adult: Secondary | ICD-10-CM | POA: Diagnosis not present

## 2020-05-23 DIAGNOSIS — R945 Abnormal results of liver function studies: Secondary | ICD-10-CM | POA: Diagnosis not present

## 2020-05-23 DIAGNOSIS — F331 Major depressive disorder, recurrent, moderate: Secondary | ICD-10-CM | POA: Diagnosis not present

## 2020-05-23 DIAGNOSIS — Z8673 Personal history of transient ischemic attack (TIA), and cerebral infarction without residual deficits: Secondary | ICD-10-CM | POA: Diagnosis not present

## 2020-05-23 DIAGNOSIS — I10 Essential (primary) hypertension: Secondary | ICD-10-CM | POA: Diagnosis not present

## 2020-05-23 DIAGNOSIS — E782 Mixed hyperlipidemia: Secondary | ICD-10-CM | POA: Diagnosis not present

## 2020-05-23 DIAGNOSIS — M1990 Unspecified osteoarthritis, unspecified site: Secondary | ICD-10-CM | POA: Diagnosis not present

## 2020-06-08 DIAGNOSIS — M170 Bilateral primary osteoarthritis of knee: Secondary | ICD-10-CM | POA: Diagnosis not present

## 2020-06-22 DIAGNOSIS — M170 Bilateral primary osteoarthritis of knee: Secondary | ICD-10-CM | POA: Diagnosis not present

## 2020-06-22 DIAGNOSIS — M179 Osteoarthritis of knee, unspecified: Secondary | ICD-10-CM | POA: Diagnosis not present

## 2020-11-16 DIAGNOSIS — M545 Low back pain, unspecified: Secondary | ICD-10-CM | POA: Diagnosis not present

## 2020-11-16 DIAGNOSIS — R82998 Other abnormal findings in urine: Secondary | ICD-10-CM | POA: Diagnosis not present

## 2020-11-16 DIAGNOSIS — R309 Painful micturition, unspecified: Secondary | ICD-10-CM | POA: Diagnosis not present

## 2020-11-16 DIAGNOSIS — N390 Urinary tract infection, site not specified: Secondary | ICD-10-CM | POA: Diagnosis not present

## 2020-11-21 DIAGNOSIS — E782 Mixed hyperlipidemia: Secondary | ICD-10-CM | POA: Diagnosis not present

## 2020-11-21 DIAGNOSIS — E119 Type 2 diabetes mellitus without complications: Secondary | ICD-10-CM | POA: Diagnosis not present

## 2020-11-24 DIAGNOSIS — R197 Diarrhea, unspecified: Secondary | ICD-10-CM | POA: Diagnosis not present

## 2020-11-24 DIAGNOSIS — E782 Mixed hyperlipidemia: Secondary | ICD-10-CM | POA: Diagnosis not present

## 2020-11-24 DIAGNOSIS — I10 Essential (primary) hypertension: Secondary | ICD-10-CM | POA: Diagnosis not present

## 2020-11-24 DIAGNOSIS — Z8673 Personal history of transient ischemic attack (TIA), and cerebral infarction without residual deficits: Secondary | ICD-10-CM | POA: Diagnosis not present

## 2020-11-24 DIAGNOSIS — R945 Abnormal results of liver function studies: Secondary | ICD-10-CM | POA: Diagnosis not present

## 2020-11-24 DIAGNOSIS — F331 Major depressive disorder, recurrent, moderate: Secondary | ICD-10-CM | POA: Diagnosis not present

## 2020-11-24 DIAGNOSIS — E1169 Type 2 diabetes mellitus with other specified complication: Secondary | ICD-10-CM | POA: Diagnosis not present

## 2020-11-24 DIAGNOSIS — M1990 Unspecified osteoarthritis, unspecified site: Secondary | ICD-10-CM | POA: Diagnosis not present

## 2020-11-30 DIAGNOSIS — M9905 Segmental and somatic dysfunction of pelvic region: Secondary | ICD-10-CM | POA: Diagnosis not present

## 2020-11-30 DIAGNOSIS — M9903 Segmental and somatic dysfunction of lumbar region: Secondary | ICD-10-CM | POA: Diagnosis not present

## 2020-11-30 DIAGNOSIS — M9902 Segmental and somatic dysfunction of thoracic region: Secondary | ICD-10-CM | POA: Diagnosis not present

## 2020-11-30 DIAGNOSIS — M5442 Lumbago with sciatica, left side: Secondary | ICD-10-CM | POA: Diagnosis not present

## 2020-12-05 DIAGNOSIS — M5442 Lumbago with sciatica, left side: Secondary | ICD-10-CM | POA: Diagnosis not present

## 2020-12-05 DIAGNOSIS — M9902 Segmental and somatic dysfunction of thoracic region: Secondary | ICD-10-CM | POA: Diagnosis not present

## 2020-12-05 DIAGNOSIS — M9905 Segmental and somatic dysfunction of pelvic region: Secondary | ICD-10-CM | POA: Diagnosis not present

## 2020-12-05 DIAGNOSIS — M9903 Segmental and somatic dysfunction of lumbar region: Secondary | ICD-10-CM | POA: Diagnosis not present

## 2020-12-07 DIAGNOSIS — M5136 Other intervertebral disc degeneration, lumbar region: Secondary | ICD-10-CM | POA: Diagnosis not present

## 2020-12-07 DIAGNOSIS — M545 Low back pain, unspecified: Secondary | ICD-10-CM | POA: Diagnosis not present

## 2020-12-12 DIAGNOSIS — M5442 Lumbago with sciatica, left side: Secondary | ICD-10-CM | POA: Diagnosis not present

## 2020-12-12 DIAGNOSIS — M9905 Segmental and somatic dysfunction of pelvic region: Secondary | ICD-10-CM | POA: Diagnosis not present

## 2020-12-12 DIAGNOSIS — M9903 Segmental and somatic dysfunction of lumbar region: Secondary | ICD-10-CM | POA: Diagnosis not present

## 2020-12-12 DIAGNOSIS — M9902 Segmental and somatic dysfunction of thoracic region: Secondary | ICD-10-CM | POA: Diagnosis not present

## 2020-12-14 DIAGNOSIS — R269 Unspecified abnormalities of gait and mobility: Secondary | ICD-10-CM | POA: Diagnosis not present

## 2020-12-14 DIAGNOSIS — M5416 Radiculopathy, lumbar region: Secondary | ICD-10-CM | POA: Diagnosis not present

## 2020-12-19 DIAGNOSIS — M5442 Lumbago with sciatica, left side: Secondary | ICD-10-CM | POA: Diagnosis not present

## 2020-12-19 DIAGNOSIS — M9905 Segmental and somatic dysfunction of pelvic region: Secondary | ICD-10-CM | POA: Diagnosis not present

## 2020-12-19 DIAGNOSIS — M9902 Segmental and somatic dysfunction of thoracic region: Secondary | ICD-10-CM | POA: Diagnosis not present

## 2020-12-19 DIAGNOSIS — M9903 Segmental and somatic dysfunction of lumbar region: Secondary | ICD-10-CM | POA: Diagnosis not present

## 2020-12-20 DIAGNOSIS — M5416 Radiculopathy, lumbar region: Secondary | ICD-10-CM | POA: Diagnosis not present

## 2020-12-22 DIAGNOSIS — M5416 Radiculopathy, lumbar region: Secondary | ICD-10-CM | POA: Diagnosis not present

## 2020-12-26 DIAGNOSIS — M5416 Radiculopathy, lumbar region: Secondary | ICD-10-CM | POA: Diagnosis not present

## 2020-12-28 DIAGNOSIS — M9903 Segmental and somatic dysfunction of lumbar region: Secondary | ICD-10-CM | POA: Diagnosis not present

## 2020-12-28 DIAGNOSIS — M9905 Segmental and somatic dysfunction of pelvic region: Secondary | ICD-10-CM | POA: Diagnosis not present

## 2020-12-28 DIAGNOSIS — M5442 Lumbago with sciatica, left side: Secondary | ICD-10-CM | POA: Diagnosis not present

## 2020-12-28 DIAGNOSIS — M9902 Segmental and somatic dysfunction of thoracic region: Secondary | ICD-10-CM | POA: Diagnosis not present

## 2020-12-29 DIAGNOSIS — M5416 Radiculopathy, lumbar region: Secondary | ICD-10-CM | POA: Diagnosis not present

## 2021-01-02 DIAGNOSIS — R269 Unspecified abnormalities of gait and mobility: Secondary | ICD-10-CM | POA: Diagnosis not present

## 2021-01-02 DIAGNOSIS — M5416 Radiculopathy, lumbar region: Secondary | ICD-10-CM | POA: Diagnosis not present

## 2021-01-04 DIAGNOSIS — R269 Unspecified abnormalities of gait and mobility: Secondary | ICD-10-CM | POA: Diagnosis not present

## 2021-01-09 DIAGNOSIS — M5416 Radiculopathy, lumbar region: Secondary | ICD-10-CM | POA: Diagnosis not present

## 2021-01-11 DIAGNOSIS — M9903 Segmental and somatic dysfunction of lumbar region: Secondary | ICD-10-CM | POA: Diagnosis not present

## 2021-01-11 DIAGNOSIS — M9905 Segmental and somatic dysfunction of pelvic region: Secondary | ICD-10-CM | POA: Diagnosis not present

## 2021-01-11 DIAGNOSIS — M5442 Lumbago with sciatica, left side: Secondary | ICD-10-CM | POA: Diagnosis not present

## 2021-01-11 DIAGNOSIS — M9902 Segmental and somatic dysfunction of thoracic region: Secondary | ICD-10-CM | POA: Diagnosis not present

## 2021-01-13 DIAGNOSIS — M5416 Radiculopathy, lumbar region: Secondary | ICD-10-CM | POA: Diagnosis not present

## 2021-01-16 DIAGNOSIS — M5416 Radiculopathy, lumbar region: Secondary | ICD-10-CM | POA: Diagnosis not present

## 2021-01-20 DIAGNOSIS — M5416 Radiculopathy, lumbar region: Secondary | ICD-10-CM | POA: Diagnosis not present

## 2021-01-23 DIAGNOSIS — M9902 Segmental and somatic dysfunction of thoracic region: Secondary | ICD-10-CM | POA: Diagnosis not present

## 2021-01-23 DIAGNOSIS — M9903 Segmental and somatic dysfunction of lumbar region: Secondary | ICD-10-CM | POA: Diagnosis not present

## 2021-01-23 DIAGNOSIS — M9905 Segmental and somatic dysfunction of pelvic region: Secondary | ICD-10-CM | POA: Diagnosis not present

## 2021-01-23 DIAGNOSIS — M5442 Lumbago with sciatica, left side: Secondary | ICD-10-CM | POA: Diagnosis not present

## 2021-01-23 DIAGNOSIS — R269 Unspecified abnormalities of gait and mobility: Secondary | ICD-10-CM | POA: Diagnosis not present

## 2021-01-23 DIAGNOSIS — M5416 Radiculopathy, lumbar region: Secondary | ICD-10-CM | POA: Diagnosis not present

## 2021-01-30 DIAGNOSIS — M5416 Radiculopathy, lumbar region: Secondary | ICD-10-CM | POA: Diagnosis not present

## 2021-02-03 DIAGNOSIS — M5416 Radiculopathy, lumbar region: Secondary | ICD-10-CM | POA: Diagnosis not present

## 2021-02-06 DIAGNOSIS — M5416 Radiculopathy, lumbar region: Secondary | ICD-10-CM | POA: Diagnosis not present

## 2021-02-08 DIAGNOSIS — M5416 Radiculopathy, lumbar region: Secondary | ICD-10-CM | POA: Diagnosis not present

## 2021-02-13 DIAGNOSIS — M5416 Radiculopathy, lumbar region: Secondary | ICD-10-CM | POA: Diagnosis not present

## 2021-02-15 DIAGNOSIS — M5416 Radiculopathy, lumbar region: Secondary | ICD-10-CM | POA: Diagnosis not present

## 2021-02-20 DIAGNOSIS — M9905 Segmental and somatic dysfunction of pelvic region: Secondary | ICD-10-CM | POA: Diagnosis not present

## 2021-02-20 DIAGNOSIS — M5442 Lumbago with sciatica, left side: Secondary | ICD-10-CM | POA: Diagnosis not present

## 2021-02-20 DIAGNOSIS — M9902 Segmental and somatic dysfunction of thoracic region: Secondary | ICD-10-CM | POA: Diagnosis not present

## 2021-02-20 DIAGNOSIS — M9903 Segmental and somatic dysfunction of lumbar region: Secondary | ICD-10-CM | POA: Diagnosis not present

## 2021-02-21 DIAGNOSIS — M5416 Radiculopathy, lumbar region: Secondary | ICD-10-CM | POA: Diagnosis not present

## 2021-02-21 DIAGNOSIS — R269 Unspecified abnormalities of gait and mobility: Secondary | ICD-10-CM | POA: Diagnosis not present

## 2021-02-23 DIAGNOSIS — M5416 Radiculopathy, lumbar region: Secondary | ICD-10-CM | POA: Diagnosis not present

## 2021-02-28 DIAGNOSIS — M5416 Radiculopathy, lumbar region: Secondary | ICD-10-CM | POA: Diagnosis not present

## 2021-02-28 DIAGNOSIS — R269 Unspecified abnormalities of gait and mobility: Secondary | ICD-10-CM | POA: Diagnosis not present

## 2021-03-02 DIAGNOSIS — M5416 Radiculopathy, lumbar region: Secondary | ICD-10-CM | POA: Diagnosis not present

## 2021-03-02 DIAGNOSIS — R269 Unspecified abnormalities of gait and mobility: Secondary | ICD-10-CM | POA: Diagnosis not present

## 2021-03-15 DIAGNOSIS — U071 COVID-19: Secondary | ICD-10-CM | POA: Diagnosis not present

## 2021-03-15 DIAGNOSIS — R059 Cough, unspecified: Secondary | ICD-10-CM | POA: Diagnosis not present

## 2021-03-15 DIAGNOSIS — R0981 Nasal congestion: Secondary | ICD-10-CM | POA: Diagnosis not present

## 2021-03-20 DIAGNOSIS — M5416 Radiculopathy, lumbar region: Secondary | ICD-10-CM | POA: Diagnosis not present

## 2021-03-20 DIAGNOSIS — M9902 Segmental and somatic dysfunction of thoracic region: Secondary | ICD-10-CM | POA: Diagnosis not present

## 2021-03-20 DIAGNOSIS — M5442 Lumbago with sciatica, left side: Secondary | ICD-10-CM | POA: Diagnosis not present

## 2021-03-20 DIAGNOSIS — M9903 Segmental and somatic dysfunction of lumbar region: Secondary | ICD-10-CM | POA: Diagnosis not present

## 2021-03-20 DIAGNOSIS — M9905 Segmental and somatic dysfunction of pelvic region: Secondary | ICD-10-CM | POA: Diagnosis not present

## 2021-03-24 DIAGNOSIS — M5416 Radiculopathy, lumbar region: Secondary | ICD-10-CM | POA: Diagnosis not present

## 2021-03-28 DIAGNOSIS — M5416 Radiculopathy, lumbar region: Secondary | ICD-10-CM | POA: Diagnosis not present

## 2021-03-31 DIAGNOSIS — M5416 Radiculopathy, lumbar region: Secondary | ICD-10-CM | POA: Diagnosis not present

## 2021-04-03 DIAGNOSIS — M5416 Radiculopathy, lumbar region: Secondary | ICD-10-CM | POA: Diagnosis not present

## 2021-04-03 DIAGNOSIS — R269 Unspecified abnormalities of gait and mobility: Secondary | ICD-10-CM | POA: Diagnosis not present

## 2021-04-07 DIAGNOSIS — M5416 Radiculopathy, lumbar region: Secondary | ICD-10-CM | POA: Diagnosis not present

## 2021-04-12 DIAGNOSIS — M5416 Radiculopathy, lumbar region: Secondary | ICD-10-CM | POA: Diagnosis not present

## 2021-04-14 DIAGNOSIS — M5416 Radiculopathy, lumbar region: Secondary | ICD-10-CM | POA: Diagnosis not present

## 2021-04-17 DIAGNOSIS — M9902 Segmental and somatic dysfunction of thoracic region: Secondary | ICD-10-CM | POA: Diagnosis not present

## 2021-04-17 DIAGNOSIS — M9905 Segmental and somatic dysfunction of pelvic region: Secondary | ICD-10-CM | POA: Diagnosis not present

## 2021-04-17 DIAGNOSIS — M9903 Segmental and somatic dysfunction of lumbar region: Secondary | ICD-10-CM | POA: Diagnosis not present

## 2021-04-17 DIAGNOSIS — M5416 Radiculopathy, lumbar region: Secondary | ICD-10-CM | POA: Diagnosis not present

## 2021-04-17 DIAGNOSIS — M5442 Lumbago with sciatica, left side: Secondary | ICD-10-CM | POA: Diagnosis not present

## 2021-04-19 DIAGNOSIS — M5416 Radiculopathy, lumbar region: Secondary | ICD-10-CM | POA: Diagnosis not present

## 2021-04-24 DIAGNOSIS — R269 Unspecified abnormalities of gait and mobility: Secondary | ICD-10-CM | POA: Diagnosis not present

## 2021-04-26 DIAGNOSIS — M5416 Radiculopathy, lumbar region: Secondary | ICD-10-CM | POA: Diagnosis not present

## 2021-05-01 DIAGNOSIS — M5416 Radiculopathy, lumbar region: Secondary | ICD-10-CM | POA: Diagnosis not present

## 2021-05-02 DIAGNOSIS — H2511 Age-related nuclear cataract, right eye: Secondary | ICD-10-CM | POA: Diagnosis not present

## 2021-05-02 DIAGNOSIS — H25043 Posterior subcapsular polar age-related cataract, bilateral: Secondary | ICD-10-CM | POA: Diagnosis not present

## 2021-05-02 DIAGNOSIS — H2513 Age-related nuclear cataract, bilateral: Secondary | ICD-10-CM | POA: Diagnosis not present

## 2021-05-02 DIAGNOSIS — H25013 Cortical age-related cataract, bilateral: Secondary | ICD-10-CM | POA: Diagnosis not present

## 2021-05-02 DIAGNOSIS — H18413 Arcus senilis, bilateral: Secondary | ICD-10-CM | POA: Diagnosis not present

## 2021-05-03 DIAGNOSIS — M170 Bilateral primary osteoarthritis of knee: Secondary | ICD-10-CM | POA: Diagnosis not present

## 2021-05-05 DIAGNOSIS — M5416 Radiculopathy, lumbar region: Secondary | ICD-10-CM | POA: Diagnosis not present

## 2021-05-08 DIAGNOSIS — M5416 Radiculopathy, lumbar region: Secondary | ICD-10-CM | POA: Diagnosis not present

## 2021-05-10 DIAGNOSIS — M170 Bilateral primary osteoarthritis of knee: Secondary | ICD-10-CM | POA: Diagnosis not present

## 2021-05-12 DIAGNOSIS — M5416 Radiculopathy, lumbar region: Secondary | ICD-10-CM | POA: Diagnosis not present

## 2021-05-15 DIAGNOSIS — M9903 Segmental and somatic dysfunction of lumbar region: Secondary | ICD-10-CM | POA: Diagnosis not present

## 2021-05-15 DIAGNOSIS — M9902 Segmental and somatic dysfunction of thoracic region: Secondary | ICD-10-CM | POA: Diagnosis not present

## 2021-05-15 DIAGNOSIS — M5442 Lumbago with sciatica, left side: Secondary | ICD-10-CM | POA: Diagnosis not present

## 2021-05-15 DIAGNOSIS — M9905 Segmental and somatic dysfunction of pelvic region: Secondary | ICD-10-CM | POA: Diagnosis not present

## 2021-05-15 DIAGNOSIS — M5416 Radiculopathy, lumbar region: Secondary | ICD-10-CM | POA: Diagnosis not present

## 2021-05-17 DIAGNOSIS — M5416 Radiculopathy, lumbar region: Secondary | ICD-10-CM | POA: Diagnosis not present

## 2021-05-17 DIAGNOSIS — M170 Bilateral primary osteoarthritis of knee: Secondary | ICD-10-CM | POA: Diagnosis not present

## 2021-05-24 DIAGNOSIS — M5416 Radiculopathy, lumbar region: Secondary | ICD-10-CM | POA: Diagnosis not present

## 2021-05-31 DIAGNOSIS — M5416 Radiculopathy, lumbar region: Secondary | ICD-10-CM | POA: Diagnosis not present

## 2021-06-07 DIAGNOSIS — M545 Low back pain, unspecified: Secondary | ICD-10-CM | POA: Diagnosis not present

## 2021-06-14 DIAGNOSIS — R269 Unspecified abnormalities of gait and mobility: Secondary | ICD-10-CM | POA: Diagnosis not present

## 2021-06-14 DIAGNOSIS — M5416 Radiculopathy, lumbar region: Secondary | ICD-10-CM | POA: Diagnosis not present

## 2021-06-19 DIAGNOSIS — M9903 Segmental and somatic dysfunction of lumbar region: Secondary | ICD-10-CM | POA: Diagnosis not present

## 2021-06-19 DIAGNOSIS — M9902 Segmental and somatic dysfunction of thoracic region: Secondary | ICD-10-CM | POA: Diagnosis not present

## 2021-06-19 DIAGNOSIS — M9905 Segmental and somatic dysfunction of pelvic region: Secondary | ICD-10-CM | POA: Diagnosis not present

## 2021-06-19 DIAGNOSIS — M5442 Lumbago with sciatica, left side: Secondary | ICD-10-CM | POA: Diagnosis not present

## 2021-06-21 DIAGNOSIS — R269 Unspecified abnormalities of gait and mobility: Secondary | ICD-10-CM | POA: Diagnosis not present

## 2021-06-21 DIAGNOSIS — M5416 Radiculopathy, lumbar region: Secondary | ICD-10-CM | POA: Diagnosis not present

## 2021-06-28 DIAGNOSIS — R269 Unspecified abnormalities of gait and mobility: Secondary | ICD-10-CM | POA: Diagnosis not present

## 2021-06-28 DIAGNOSIS — M545 Low back pain, unspecified: Secondary | ICD-10-CM | POA: Diagnosis not present

## 2021-07-14 DIAGNOSIS — M5442 Lumbago with sciatica, left side: Secondary | ICD-10-CM | POA: Diagnosis not present

## 2021-07-14 DIAGNOSIS — M9902 Segmental and somatic dysfunction of thoracic region: Secondary | ICD-10-CM | POA: Diagnosis not present

## 2021-07-14 DIAGNOSIS — M9905 Segmental and somatic dysfunction of pelvic region: Secondary | ICD-10-CM | POA: Diagnosis not present

## 2021-07-14 DIAGNOSIS — M9903 Segmental and somatic dysfunction of lumbar region: Secondary | ICD-10-CM | POA: Diagnosis not present

## 2021-07-17 DIAGNOSIS — H2511 Age-related nuclear cataract, right eye: Secondary | ICD-10-CM | POA: Diagnosis not present

## 2021-07-18 DIAGNOSIS — H2512 Age-related nuclear cataract, left eye: Secondary | ICD-10-CM | POA: Diagnosis not present

## 2021-08-04 DIAGNOSIS — M9902 Segmental and somatic dysfunction of thoracic region: Secondary | ICD-10-CM | POA: Diagnosis not present

## 2021-08-04 DIAGNOSIS — M9905 Segmental and somatic dysfunction of pelvic region: Secondary | ICD-10-CM | POA: Diagnosis not present

## 2021-08-04 DIAGNOSIS — M5442 Lumbago with sciatica, left side: Secondary | ICD-10-CM | POA: Diagnosis not present

## 2021-08-04 DIAGNOSIS — M9903 Segmental and somatic dysfunction of lumbar region: Secondary | ICD-10-CM | POA: Diagnosis not present

## 2021-08-07 DIAGNOSIS — H2512 Age-related nuclear cataract, left eye: Secondary | ICD-10-CM | POA: Diagnosis not present

## 2021-08-23 DIAGNOSIS — Z0001 Encounter for general adult medical examination with abnormal findings: Secondary | ICD-10-CM | POA: Diagnosis not present

## 2021-09-29 DIAGNOSIS — M9902 Segmental and somatic dysfunction of thoracic region: Secondary | ICD-10-CM | POA: Diagnosis not present

## 2021-09-29 DIAGNOSIS — M9905 Segmental and somatic dysfunction of pelvic region: Secondary | ICD-10-CM | POA: Diagnosis not present

## 2021-09-29 DIAGNOSIS — M9903 Segmental and somatic dysfunction of lumbar region: Secondary | ICD-10-CM | POA: Diagnosis not present

## 2021-09-29 DIAGNOSIS — M5442 Lumbago with sciatica, left side: Secondary | ICD-10-CM | POA: Diagnosis not present

## 2021-10-27 DIAGNOSIS — I10 Essential (primary) hypertension: Secondary | ICD-10-CM | POA: Diagnosis not present

## 2021-10-27 DIAGNOSIS — E1169 Type 2 diabetes mellitus with other specified complication: Secondary | ICD-10-CM | POA: Diagnosis not present

## 2021-10-27 DIAGNOSIS — E782 Mixed hyperlipidemia: Secondary | ICD-10-CM | POA: Diagnosis not present

## 2021-11-01 DIAGNOSIS — Z0001 Encounter for general adult medical examination with abnormal findings: Secondary | ICD-10-CM | POA: Diagnosis not present

## 2021-11-01 DIAGNOSIS — F331 Major depressive disorder, recurrent, moderate: Secondary | ICD-10-CM | POA: Diagnosis not present

## 2021-11-01 DIAGNOSIS — E1169 Type 2 diabetes mellitus with other specified complication: Secondary | ICD-10-CM | POA: Diagnosis not present

## 2021-11-01 DIAGNOSIS — R945 Abnormal results of liver function studies: Secondary | ICD-10-CM | POA: Diagnosis not present

## 2021-11-01 DIAGNOSIS — Z8673 Personal history of transient ischemic attack (TIA), and cerebral infarction without residual deficits: Secondary | ICD-10-CM | POA: Diagnosis not present

## 2021-11-01 DIAGNOSIS — M1990 Unspecified osteoarthritis, unspecified site: Secondary | ICD-10-CM | POA: Diagnosis not present

## 2021-11-01 DIAGNOSIS — E782 Mixed hyperlipidemia: Secondary | ICD-10-CM | POA: Diagnosis not present

## 2021-11-01 DIAGNOSIS — I10 Essential (primary) hypertension: Secondary | ICD-10-CM | POA: Diagnosis not present

## 2021-11-30 DIAGNOSIS — M9903 Segmental and somatic dysfunction of lumbar region: Secondary | ICD-10-CM | POA: Diagnosis not present

## 2021-11-30 DIAGNOSIS — M5442 Lumbago with sciatica, left side: Secondary | ICD-10-CM | POA: Diagnosis not present

## 2021-11-30 DIAGNOSIS — M9905 Segmental and somatic dysfunction of pelvic region: Secondary | ICD-10-CM | POA: Diagnosis not present

## 2021-11-30 DIAGNOSIS — M9902 Segmental and somatic dysfunction of thoracic region: Secondary | ICD-10-CM | POA: Diagnosis not present

## 2021-12-28 ENCOUNTER — Ambulatory Visit
Admission: EM | Admit: 2021-12-28 | Discharge: 2021-12-28 | Disposition: A | Discharge status: Home / Self Care | Payer: Medicare PPO | Attending: Family Medicine | Admitting: Family Medicine

## 2021-12-28 ENCOUNTER — Encounter: Payer: Self-pay | Admitting: *Deleted

## 2021-12-28 DIAGNOSIS — N390 Urinary tract infection, site not specified: Secondary | ICD-10-CM | POA: Insufficient documentation

## 2021-12-28 LAB — POCT URINALYSIS DIP (MANUAL ENTRY)
Bilirubin, UA: NEGATIVE
Blood, UA: NEGATIVE
Glucose, UA: NEGATIVE mg/dL
Ketones, POC UA: NEGATIVE mg/dL
Nitrite, UA: POSITIVE — AB
Protein Ur, POC: NEGATIVE mg/dL
Spec Grav, UA: 1.015 (ref 1.010–1.025)
Urobilinogen, UA: 0.2 E.U./dL
pH, UA: 6 (ref 5.0–8.0)

## 2021-12-28 MED ORDER — CEPHALEXIN 500 MG PO CAPS: 500.0000 mg | ORAL_CAPSULE | Freq: Two times a day (BID) | ORAL | 0 refills | Status: DC

## 2021-12-28 NOTE — ED Triage Notes (Signed)
Pt states that she has noticed a foul odor in her urine and some urine frequency x 5 days. She hasnt taken any meds OTC but increased her water intake.

## 2021-12-28 NOTE — ED Provider Notes (Signed)
[facility]    CSN: [id number] Arrival date & time: 12/28/21  1613      History   Chief Complaint Chief Complaint  Patient presents with   Urinary Frequency    HPI Joanna Good is a 71 y.o. female.   Patient presenting today with almost a week of strong odor to her urine and urinary frequency.  States the symptoms have somewhat improved over the course of the week but she still wanted to get checked out as she is prone to urinary tract infections.  Not trying anything over-the-counter for symptoms.  Denies fever, chills, flank pain, nausea, vomiting, hematuria.    Past Medical History:  Diagnosis Date   Acid reflux    Arthritis    Cystocele    Hypertension    Kidney stones    Osteopenia 09/2014   T score -2.4 distal third left radius. Remainder of DEXA normal   Stroke [facility])    Uterine prolapse     Patient Active Problem List   Diagnosis Date Noted   Change in bowel habits    Sepsis ([place]) 02/23/2018   Shock ([place])    Gallstone ileus ([place])    Pneumobilia    Embolic stroke ([place]) 86/76/7209   Kidney stones    Uterine prolapse    Cystocele    HYPERLIPIDEMIA TYPE IIB / III 12/22/2008    Past Surgical History:  Procedure Laterality Date   COLON RESECTION N/A 02/24/2018   Procedure: LAPAROSCOPIC ASSISTED/SMALL BOWEL RESECTION convereted to open ;  Surgeon: Georganna Skeans, MD;  Location: [facility];  Service: General;  Laterality: N/A;   TONSILLECTOMY      OB History     Gravida  2   Para  2   Term  2   Preterm      AB      Living  2      SAB      IAB      Ectopic      Multiple      Live Births               Home Medications    Prior to Admission medications   Medication Sig Start Date End Date Taking? Authorizing Provider  aspirin EC 325 MG tablet Take 1 tablet (325 mg total) by mouth daily. 07/17/16  Yes Rosalin Hawking, MD  Calcium Carbonate-Vitamin D (CALCIUM + D PO) Take by mouth.   Yes [provider]  cephALEXin  (KEFLEX) 500 MG capsule Take 1 capsule (500 mg total) by mouth 2 (two) times daily. 12/28/21  Yes Volney American, PA-C  escitalopram (LEXAPRO) 5 MG tablet Take 1 tablet by mouth daily. 06/14/17  Yes [provider]  esomeprazole (NEXIUM) 20 MG capsule Take 20 mg by mouth daily at 12 noon.   Yes [provider]  ezetimibe (ZETIA) 10 MG tablet Take 10 mg by mouth daily.   Yes [provider]  metFORMIN (GLUCOPHAGE) 500 MG tablet Take 500 mg by mouth daily. 03/07/20  Yes [provider]  Multiple Vitamin (MULTIVITAMIN) tablet Take 1 tablet by mouth daily.   Yes [provider]  NEXLETOL 180 MG TABS Take 1 tablet by mouth daily. 04/13/20  Yes [provider]  Omega-3 Fatty Acids (FISH OIL PO) Take by mouth.   Yes [provider]  Turmeric (QC TUMERIC COMPLEX) 500 MG CAPS Take 1 capsule by mouth daily.   Yes [provider]  celecoxib (CELEBREX) 100 MG  capsule Take 1 capsule by mouth daily. Patient not taking: Reported on 05/07/2020 07/09/14   [provider]  OVER THE COUNTER MEDICATION     [provider]  traMADol (ULTRAM) 50 MG tablet Take 1 tablet (50 mg total) by mouth every 6 (six) hours as needed for moderate pain. Patient not taking: Reported on 05/07/2020 02/28/18   Burnadette Pop, MD  triamcinolone (NASACORT) 55 MCG/ACT AERO nasal inhaler Place 2 sprays into the nose daily. Patient not taking: Reported on 05/07/2020    [provider]    Family History Family History  Problem Relation Age of Onset   Heart disease Mother    Hypertension Mother    Diabetes Father    Heart disease Father    Hypertension Father    Diabetes Sister    Hypertension Sister    Breast cancer Maternal Aunt        Age 41   Breast cancer Paternal Aunt        Age 1    Social History Social History   Tobacco Use   Smoking status: Never   Smokeless tobacco: Never  Substance Use Topics   Alcohol use: Yes     Alcohol/week: 1.0 standard drink of alcohol    Types: 1 Glasses of wine per week    Comment: Rare   Drug use: No     Allergies   Bee venom, Claritin [loratadine], Ibuprofen, and Sulfonamide derivatives   Review of Systems Review of Systems Per HPI  Physical Exam Triage Vital Signs ED Triage Vitals  Enc Vitals Group     BP 12/28/21 1638 (!) 162/87     Pulse Rate 12/28/21 1638 (!) 2     Resp 12/28/21 1638 18     Temp 12/28/21 1638 98.6 F (37 C)     Temp Source 12/28/21 1638 Oral     SpO2 12/28/21 1638 96 %     Weight --      Height --      Head Circumference --      Peak Flow --      Pain Score 12/28/21 1636 0     Pain Loc --      Pain Edu? --      Excl. in GC? --    No data found.  Updated Vital Signs BP (!) 162/87 (BP Location: Right Arm)   Pulse 99   Temp 98.6 F (37 C) (Oral)   Resp 18   SpO2 96%   Visual Acuity Right Eye Distance:   Left Eye Distance:   Bilateral Distance:    Right Eye Near:   Left Eye Near:    Bilateral Near:     Physical Exam Vitals and nursing note reviewed.  Constitutional:      Appearance: Normal appearance. She is not ill-appearing.  HENT:     Head: Atraumatic.  Eyes:     Extraocular Movements: Extraocular movements intact.     Conjunctiva/sclera: Conjunctivae normal.  Cardiovascular:     Rate and Rhythm: Normal rate and regular rhythm.     Heart sounds: Normal heart sounds.  Pulmonary:     Effort: Pulmonary effort is normal.     Breath sounds: Normal breath sounds.  Abdominal:     General: Bowel sounds are normal. There is no distension.     Palpations: Abdomen is soft.     Tenderness: There is no abdominal tenderness. There is no right CVA tenderness, left CVA tenderness or guarding.  Musculoskeletal:  General: Normal range of motion.     Cervical back: Normal range of motion and neck supple.  Skin:    General: Skin is warm and dry.  Neurological:     Mental Status: She is alert and oriented to person,  place, and time.  Psychiatric:        Mood and Affect: Mood normal.        Thought Content: Thought content normal.        Judgment: Judgment normal.      UC Treatments / Results  Labs (all labs ordered are listed, but only abnormal results are displayed) Labs Reviewed  POCT URINALYSIS DIP (MANUAL ENTRY) - Abnormal; Notable for the following components:      Result Value   Nitrite, UA Positive (*)    Leukocytes, UA Small (1+) (*)    All other components within normal limits  URINE CULTURE    EKG   Radiology No results found.  Procedures Procedures (including critical care time)  Medications Ordered in UC Medications - No data to display  Initial Impression / Assessment and Plan / UC Course  I have reviewed the triage vital signs and the nursing notes.  Pertinent labs & imaging results that were available during my care of the patient were reviewed by me and considered in my medical decision making (see chart for details).     Urinalysis showing evidence of urinary tract infection, treat with Keflex, await urine culture and adjust if needed.  Push fluids, return for worsening symptoms.  Final Clinical Impressions(s) / UC Diagnoses   Final diagnoses:  Acute lower UTI   Discharge Instructions   None    ED Prescriptions     Medication Sig Dispense Auth. Provider   cephALEXin (KEFLEX) 500 MG capsule Take 1 capsule (500 mg total) by mouth 2 (two) times daily. 10 capsule Volney American, [place]      PDMP not reviewed this encounter.   Volney American, [place] 12/28/21 1815

## 2021-12-30 LAB — URINE CULTURE

## 2021-12-31 LAB — URINE CULTURE: Culture: >=100000 — AB

## 2022-03-13 DIAGNOSIS — E119 Type 2 diabetes mellitus without complications: Secondary | ICD-10-CM | POA: Diagnosis not present

## 2022-04-17 DIAGNOSIS — M9902 Segmental and somatic dysfunction of thoracic region: Secondary | ICD-10-CM | POA: Diagnosis not present

## 2022-04-17 DIAGNOSIS — M9903 Segmental and somatic dysfunction of lumbar region: Secondary | ICD-10-CM | POA: Diagnosis not present

## 2022-04-17 DIAGNOSIS — M62830 Muscle spasm of back: Secondary | ICD-10-CM | POA: Diagnosis not present

## 2022-04-17 DIAGNOSIS — M9905 Segmental and somatic dysfunction of pelvic region: Secondary | ICD-10-CM | POA: Diagnosis not present

## 2022-04-17 DIAGNOSIS — M546 Pain in thoracic spine: Secondary | ICD-10-CM | POA: Diagnosis not present

## 2022-04-19 ENCOUNTER — Ambulatory Visit
Admission: RE | Admit: 2022-04-19 | Discharge: 2022-04-19 | Disposition: A | Discharge status: Home / Self Care | Payer: Medicare PPO | Source: Ambulatory Visit | Attending: Nurse Practitioner | Admitting: Nurse Practitioner

## 2022-04-19 VITALS — BP 173/93 | HR 107 | Temp 99.0°F | Resp 20

## 2022-04-19 DIAGNOSIS — R82998 Other abnormal findings in urine: Secondary | ICD-10-CM | POA: Insufficient documentation

## 2022-04-19 DIAGNOSIS — R350 Frequency of micturition: Secondary | ICD-10-CM | POA: Diagnosis not present

## 2022-04-19 LAB — POCT URINALYSIS DIP (MANUAL ENTRY)
Bilirubin, UA: NEGATIVE
Blood, UA: NEGATIVE
Glucose, UA: NEGATIVE mg/dL
Ketones, POC UA: NEGATIVE mg/dL
Nitrite, UA: POSITIVE — AB
Protein Ur, POC: NEGATIVE mg/dL
Spec Grav, UA: 1.015 (ref 1.010–1.025)
Urobilinogen, UA: 0.2 E.U./dL
pH, UA: 7.5 (ref 5.0–8.0)

## 2022-04-19 MED ORDER — AMPICILLIN 500 MG PO CAPS: 500.0000 mg | ORAL_CAPSULE | Freq: Three times a day (TID) | ORAL | 0 refills | Status: AC

## 2022-04-19 NOTE — Discharge Instructions (Addendum)
Your urinalysis is positive for leukoctyes. Urine culture pending this confirms the type of bacteria and if the treatment is effective. Due to your symptoms and history you have been started on a treatment of  Ampicillin 500 mg three times a day for 5 days.  Encourage completion of your treatment even when symptoms improve.  Discussed resistance with antibiotic if over used.   Discussed allergic reactions with antibiotics Encourage increasing hydration with water and how to tell when this is achieved with pale yellow to clear urine Add cranberry juice 100% 8 -16 ozs daily until symptoms improve Discussed hygiene and voiding when the urge presents

## 2022-04-19 NOTE — ED Provider Notes (Addendum)
[facility]    CSN: [id number] Arrival date & time: 04/19/22  1544      History   Chief Complaint Chief Complaint  Patient presents with   Urinary Frequency    Probable uti - Entered by patient    HPI Joanna Good is a 72 y.o. female.   HPI  She is in today for urinary frequency with odor for 2 days. She reports cloudy urine.  She is also having low back pain . Denies headache, dizziness, visual changes, shortness of breath, dyspnea on exertion, chest pain, nausea, vomiting or any edema.   Past Medical History:  Diagnosis Date   Acid reflux    Arthritis    Cystocele    Hypertension    Kidney stones    Osteopenia 09/2014   T score -2.4 distal third left radius. 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   Kidney stones    Uterine prolapse    Cystocele    HYPERLIPIDEMIA TYPE IIB / III 12/22/2008    Past Surgical History:  Procedure Laterality Date   COLON RESECTION N/A 02/24/2018   Procedure: LAPAROSCOPIC ASSISTED/SMALL BOWEL RESECTION convereted to open ;  Surgeon: Georganna Skeans, MD;  Location: [facility];  Service: General;  Laterality: N/A;   TONSILLECTOMY      OB History     Gravida  2   Para  2   Term  2   Preterm      AB      Living  2      SAB      IAB      Ectopic      Multiple      Live Births               Home Medications    Prior to Admission medications   Medication Sig Start Date End Date Taking? Authorizing Provider  ampicillin (PRINCIPEN) 500 MG capsule Take 1 capsule (500 mg total) by mouth 3 (three) times daily for 5 days. 04/19/22 04/24/22 Yes Vevelyn Francois, NP  aspirin EC 325 MG tablet Take 1 tablet (325 mg total) by mouth daily. 07/17/16   Rosalin Hawking, MD  Calcium Carbonate-Vitamin D  (CALCIUM + D PO) Take by mouth.    [provider]  escitalopram (LEXAPRO) 5 MG tablet Take 1 tablet by mouth daily. 06/14/17   [provider]  esomeprazole (NEXIUM) 20 MG capsule Take 20 mg by mouth daily at 12 noon.    [provider]  ezetimibe (ZETIA) 10 MG tablet Take 10 mg by mouth daily.    [provider]  metFORMIN (GLUCOPHAGE) 500 MG tablet Take 500 mg by mouth daily. 03/07/20   [provider]  Multiple Vitamin (MULTIVITAMIN) tablet Take 1 tablet by mouth daily.    [provider]  NEXLETOL 180 MG TABS Take 1 tablet by mouth daily. 04/13/20   [provider]  Omega-3 Fatty Acids (FISH OIL PO) Take by mouth.    [provider]  OVER THE COUNTER MEDICATION     [provider]    Family History Family History  Problem Relation Age of Onset   Heart disease Mother  Hypertension Mother    Diabetes Father    Heart disease Father    Hypertension Father    Diabetes Sister    Hypertension Sister    Breast cancer Maternal Aunt        Age 45   Breast cancer Paternal Aunt        Age 54    Social History Social History   Tobacco Use   Smoking status: Never   Smokeless tobacco: Never  Substance Use Topics   Alcohol use: Yes    Alcohol/week: 1.0 standard drink of alcohol    Types: 1 Glasses of wine per week    Comment: Rare   Drug use: No     Allergies   Bee venom, Claritin [loratadine], Ibuprofen, and Sulfonamide derivatives   Review of Systems Review of Systems   Physical Exam Triage Vital Signs ED Triage Vitals  Enc Vitals Group     BP 04/19/22 1603 (!) 173/93     Pulse Rate 04/19/22 1603 (!) 107     Resp 04/19/22 1603 20     Temp 04/19/22 1603 99 F (37.2 C)     Temp Source 04/19/22 1603 Oral     SpO2 04/19/22 1603 95 %     Weight --      Height --      Head Circumference --      Peak Flow --      Pain Score 04/19/22 1605 5     Pain Loc --      Pain Edu? --      Excl. in  [place]? --    No data found.  Updated Vital Signs BP (!) 173/93 (BP Location: Right Arm)   Pulse (!) 107   Temp 99 F (37.2 C) (Oral)   Resp 20   SpO2 95%   Visual Acuity Right Eye Distance:   Left Eye Distance:   Bilateral Distance:    Right Eye Near:   Left Eye Near:    Bilateral Near:     Physical Exam Constitutional:      General: She is not in acute distress.    Appearance: She is obese. She is not ill-appearing or diaphoretic.  HENT:     Head: Normocephalic and atraumatic.     Nose: Nose normal.     Mouth/Throat:     Mouth: Mucous membranes are moist.  Cardiovascular:     Rate and Rhythm: Normal rate.  Pulmonary:     Effort: Pulmonary effort is normal.  Abdominal:     Tenderness: There is left CVA tenderness.  Musculoskeletal:        General: Normal range of motion.  Skin:    General: Skin is warm and dry.     Capillary Refill: Capillary refill takes less than 2 seconds.  Neurological:     General: No focal deficit present.     Mental Status: She is alert and oriented to person, place, and time.  Psychiatric:        Mood and Affect: Mood normal.        Behavior: Behavior normal.     UC Treatments / Results  Labs (all labs ordered are listed, but only abnormal results are displayed) Labs Reviewed  POCT URINALYSIS DIP (MANUAL ENTRY) - Abnormal; Notable for the following components:      Result Value   Clarity, UA cloudy (*)    Nitrite, UA Positive (*)    Leukocytes, UA Small (1+) (*)    All other  components within normal limits  URINE CULTURE    EKG   Radiology No results found.  Procedures Procedures (including critical care time)  Medications Ordered in UC Medications - No data to display  Initial Impression / Assessment and Plan / UC Course  I have reviewed the triage vital signs and the nursing notes.  Pertinent labs & imaging results that were available during my care of the patient were reviewed by me and considered in my medical  decision making (see chart for details).     Dysuria  Final Clinical Impressions(s) / UC Diagnoses   Final diagnoses:  Urinary frequency     Discharge Instructions      Your urinalysis is positive for leukoctyes. Urine culture pending this confirms the type of bacteria and if the treatment is effective. Due to your symptoms and history you have been started on a treatment of  Ampicillin 500 mg three times a day for 5 days.  Encourage completion of your treatment even when symptoms improve.  Discussed resistance with antibiotic if over used.   Discussed allergic reactions with antibiotics Encourage increasing hydration with water and how to tell when this is achieved with pale yellow to clear urine Add cranberry juice 100% 8 -16 ozs daily until symptoms improve Discussed hygiene and voiding when the urge presents      ED Prescriptions     Medication Sig Dispense Auth. Provider   ampicillin (PRINCIPEN) 500 MG capsule Take 1 capsule (500 mg total) by mouth 3 (three) times daily for 5 days. 15 capsule Vevelyn Francois, NP      PDMP not reviewed this encounter.   Vevelyn Francois, NP 04/20/22 1033    Vevelyn Francois, NP 04/20/22 [phone]

## 2022-04-19 NOTE — ED Triage Notes (Signed)
Pt report urine has a foul smell, cloudy, low back pain, and frequent urination x 2 days. Has been happening on and off.  Took azo at 9 am

## 2022-04-21 LAB — URINE CULTURE: Culture: >=100000 — AB

## 2022-04-23 DIAGNOSIS — M546 Pain in thoracic spine: Secondary | ICD-10-CM | POA: Diagnosis not present

## 2022-04-23 DIAGNOSIS — M62830 Muscle spasm of back: Secondary | ICD-10-CM | POA: Diagnosis not present

## 2022-04-23 DIAGNOSIS — M9902 Segmental and somatic dysfunction of thoracic region: Secondary | ICD-10-CM | POA: Diagnosis not present

## 2022-04-23 DIAGNOSIS — M9905 Segmental and somatic dysfunction of pelvic region: Secondary | ICD-10-CM | POA: Diagnosis not present

## 2022-04-23 DIAGNOSIS — M9903 Segmental and somatic dysfunction of lumbar region: Secondary | ICD-10-CM | POA: Diagnosis not present

## 2022-04-30 DIAGNOSIS — M9903 Segmental and somatic dysfunction of lumbar region: Secondary | ICD-10-CM | POA: Diagnosis not present

## 2022-04-30 DIAGNOSIS — M546 Pain in thoracic spine: Secondary | ICD-10-CM | POA: Diagnosis not present

## 2022-04-30 DIAGNOSIS — M9905 Segmental and somatic dysfunction of pelvic region: Secondary | ICD-10-CM | POA: Diagnosis not present

## 2022-04-30 DIAGNOSIS — M9902 Segmental and somatic dysfunction of thoracic region: Secondary | ICD-10-CM | POA: Diagnosis not present

## 2022-04-30 DIAGNOSIS — M62830 Muscle spasm of back: Secondary | ICD-10-CM | POA: Diagnosis not present

## 2022-05-01 DIAGNOSIS — I10 Essential (primary) hypertension: Secondary | ICD-10-CM | POA: Diagnosis not present

## 2022-05-01 DIAGNOSIS — E1169 Type 2 diabetes mellitus with other specified complication: Secondary | ICD-10-CM | POA: Diagnosis not present

## 2022-05-01 DIAGNOSIS — E782 Mixed hyperlipidemia: Secondary | ICD-10-CM | POA: Diagnosis not present

## 2022-05-07 DIAGNOSIS — M1990 Unspecified osteoarthritis, unspecified site: Secondary | ICD-10-CM | POA: Diagnosis not present

## 2022-05-07 DIAGNOSIS — Z8673 Personal history of transient ischemic attack (TIA), and cerebral infarction without residual deficits: Secondary | ICD-10-CM | POA: Diagnosis not present

## 2022-05-07 DIAGNOSIS — F331 Major depressive disorder, recurrent, moderate: Secondary | ICD-10-CM | POA: Diagnosis not present

## 2022-05-07 DIAGNOSIS — E669 Obesity, unspecified: Secondary | ICD-10-CM | POA: Diagnosis not present

## 2022-05-07 DIAGNOSIS — Z0000 Encounter for general adult medical examination without abnormal findings: Secondary | ICD-10-CM | POA: Diagnosis not present

## 2022-05-07 DIAGNOSIS — R945 Abnormal results of liver function studies: Secondary | ICD-10-CM | POA: Diagnosis not present

## 2022-05-07 DIAGNOSIS — E782 Mixed hyperlipidemia: Secondary | ICD-10-CM | POA: Diagnosis not present

## 2022-05-07 DIAGNOSIS — I10 Essential (primary) hypertension: Secondary | ICD-10-CM | POA: Diagnosis not present

## 2022-05-07 DIAGNOSIS — E119 Type 2 diabetes mellitus without complications: Secondary | ICD-10-CM | POA: Diagnosis not present

## 2022-05-28 DIAGNOSIS — M9905 Segmental and somatic dysfunction of pelvic region: Secondary | ICD-10-CM | POA: Diagnosis not present

## 2022-05-28 DIAGNOSIS — M9902 Segmental and somatic dysfunction of thoracic region: Secondary | ICD-10-CM | POA: Diagnosis not present

## 2022-05-28 DIAGNOSIS — M546 Pain in thoracic spine: Secondary | ICD-10-CM | POA: Diagnosis not present

## 2022-05-28 DIAGNOSIS — M62830 Muscle spasm of back: Secondary | ICD-10-CM | POA: Diagnosis not present

## 2022-05-28 DIAGNOSIS — M9903 Segmental and somatic dysfunction of lumbar region: Secondary | ICD-10-CM | POA: Diagnosis not present

## 2022-07-02 ENCOUNTER — Encounter: Payer: Medicare PPO | Attending: Family Medicine | Admitting: Nutrition

## 2022-07-02 VITALS — Ht 62.0 in | Wt 222.0 lb

## 2022-07-02 DIAGNOSIS — M9902 Segmental and somatic dysfunction of thoracic region: Secondary | ICD-10-CM | POA: Diagnosis not present

## 2022-07-02 DIAGNOSIS — E782 Mixed hyperlipidemia: Secondary | ICD-10-CM | POA: Insufficient documentation

## 2022-07-02 DIAGNOSIS — M546 Pain in thoracic spine: Secondary | ICD-10-CM | POA: Diagnosis not present

## 2022-07-02 DIAGNOSIS — M62830 Muscle spasm of back: Secondary | ICD-10-CM | POA: Diagnosis not present

## 2022-07-02 DIAGNOSIS — E118 Type 2 diabetes mellitus with unspecified complications: Secondary | ICD-10-CM | POA: Diagnosis not present

## 2022-07-02 DIAGNOSIS — M9903 Segmental and somatic dysfunction of lumbar region: Secondary | ICD-10-CM | POA: Diagnosis not present

## 2022-07-02 DIAGNOSIS — M9905 Segmental and somatic dysfunction of pelvic region: Secondary | ICD-10-CM | POA: Diagnosis not present

## 2022-07-02 NOTE — Patient Instructions (Signed)
Goals  Cut out diet sodas Eat more at home and meal planning Focus on more whole plant based foods-fruits, vegetables and whole grains. Do the Full plate living program.

## 2022-07-02 NOTE — Progress Notes (Unsigned)
Medical Nutrition Therapy  Appointment Start time:  1400  Appointment End time:  1500  Primary concerns today: DM Type 2, Obesity  Referral diagnosis: E11.8, E66.0 Preferred learning style: Read, Readiness: Ready   NUTRITION ASSESSMENT  72 yr old female here for Dm Education and weight loss. PCP Leone Payor. PMH: Obesity, Type 2 DM, Hyperlipidemia Lost her a husband a few years ago. Is involved in caring for her grandkids at times. Tends to snack or graze often and not eat complete meals. Likes cheese and crackers often. A1C 6.6%. Taking Metformin 500 mg once a day Not testing blood sugars.  Ready to make lifestyle changes with her diet and excise to lose weight and get her DM reversed.   Anthropometrics  Wt Readings from Last 3 Encounters:  07/02/22 222 lb (100.7 kg)  02/25/18 234 lb 2.1 oz (106.2 kg)  05/14/17 200 lb (90.7 kg)   Ht Readings from Last 3 Encounters:  07/02/22 5\' 2"  (1.575 m)  02/23/18 5\' 3"  (1.6 m)  05/14/17 5\' 3"  (1.6 m)   Body mass index is 40.6 kg/m. @BMIFA @ Facility age limit for growth %iles is 20 years. Facility age limit for growth %iles is 20 years.  Clinical Medical Hx: See chart Medications: metformin 500 mg once  a day. Labs:  A1C 6.5 to 6.4%.   Notable Signs/Symptoms: none  Lifestyle & Dietary Hx [place].  Eats out and some at home  Estimated daily fluid intake: 60 oz Supplements: MVI, Calclium, Omega 3 fish oil,  Sleep: varies. Stays up at nigtht sometimes and sleeps some during the day Stress / self-care:  Current average weekly physical activity: ADL  24-Hr Dietary Recall Eats 1-2 meals per day. Snacks often instead of balanced meals.  Estimated Energy Needs Calories: 1200 Carbohydrate: 135g Protein: 90g Fat: 33g   NUTRITION DIAGNOSIS  NI-1.7 Predicted excessive energy intake As related to Diabetes Type 2 and Obesity.  As evidenced by BMI 40 and A1C 6.6%.   NUTRITION INTERVENTION  Nutrition education (E-1) on the  following topics:  Nutrition and Diabetes education provided on My Plate, CHO counting, meal planning, portion sizes, timing of meals, avoiding snacks between meals unless having a low blood sugar, target ranges for A1C and blood sugars, signs/symptoms and treatment of hyper/hypoglycemia, monitoring blood sugars, taking medications as prescribed, benefits of exercising 30 minutes per day and prevention of complications of DM.  Lifestyle Medicine  - Whole Food, Plant Predominant Nutrition is highly recommended: Eat Plenty of vegetables, Mushrooms, fruits, Legumes, Whole Grains, Nuts, seeds in lieu of processed meats, processed snacks/pastries red meat, poultry, eggs.    -It is better to avoid simple carbohydrates including: Cakes, Sweet Desserts, Ice Cream, Soda (diet and regular), Sweet Tea, Candies, Chips, Cookies, Store Bought Juices, Alcohol in Excess of  1-2 drinks a day, Lemonade,  Artificial Sweeteners, Doughnuts, Coffee Creamers, "Sugar-free" Products, etc, etc.  This is not a complete list.....  Exercise: If you are able: 30 -60 minutes a day ,4 days a week, or 150 minutes a week.  The longer the better.  Combine stretch, strength, and aerobic activities.  If you were told in the past that you have high risk for cardiovascular diseases, you may seek evaluation by your heart doctor prior to initiating moderate to intense exercise programs.   Handouts Provided Include  Know Your  numbers Lifestyle Medicine   Learning Style & Readiness for Change Teaching method utilized: Visual & Auditory  Demonstrated degree of understanding via: Teach Back  Barriers  to learning/adherence to lifestyle change: None  Goals Established by Pt Goals  Cut out diet sodas Eat more at home and meal planning Focus on more whole plant based foods-fruits, vegetables and whole grains. Do the Full plate living program.   MONITORING & EVALUATION Dietary intake, weekly physical activity, and weight in 1  month.  Next Steps  Patient is to work on meal planning and exercise.Marland Kitchen

## 2022-07-04 ENCOUNTER — Encounter: Payer: Self-pay | Admitting: Nutrition

## 2022-08-01 DIAGNOSIS — M546 Pain in thoracic spine: Secondary | ICD-10-CM | POA: Diagnosis not present

## 2022-08-01 DIAGNOSIS — M9902 Segmental and somatic dysfunction of thoracic region: Secondary | ICD-10-CM | POA: Diagnosis not present

## 2022-08-01 DIAGNOSIS — M62830 Muscle spasm of back: Secondary | ICD-10-CM | POA: Diagnosis not present

## 2022-08-01 DIAGNOSIS — M9903 Segmental and somatic dysfunction of lumbar region: Secondary | ICD-10-CM | POA: Diagnosis not present

## 2022-08-01 DIAGNOSIS — M9905 Segmental and somatic dysfunction of pelvic region: Secondary | ICD-10-CM | POA: Diagnosis not present

## 2022-09-07 DIAGNOSIS — M9903 Segmental and somatic dysfunction of lumbar region: Secondary | ICD-10-CM | POA: Diagnosis not present

## 2022-09-07 DIAGNOSIS — M546 Pain in thoracic spine: Secondary | ICD-10-CM | POA: Diagnosis not present

## 2022-09-07 DIAGNOSIS — M9905 Segmental and somatic dysfunction of pelvic region: Secondary | ICD-10-CM | POA: Diagnosis not present

## 2022-09-07 DIAGNOSIS — M9902 Segmental and somatic dysfunction of thoracic region: Secondary | ICD-10-CM | POA: Diagnosis not present

## 2022-09-07 DIAGNOSIS — M62830 Muscle spasm of back: Secondary | ICD-10-CM | POA: Diagnosis not present

## 2022-09-11 ENCOUNTER — Ambulatory Visit: Payer: Medicare PPO | Admitting: Nutrition

## 2022-10-10 ENCOUNTER — Ambulatory Visit: Payer: Medicare PPO | Admitting: Nutrition

## 2022-10-19 DIAGNOSIS — M546 Pain in thoracic spine: Secondary | ICD-10-CM | POA: Diagnosis not present

## 2022-10-19 DIAGNOSIS — M9902 Segmental and somatic dysfunction of thoracic region: Secondary | ICD-10-CM | POA: Diagnosis not present

## 2022-10-19 DIAGNOSIS — M9903 Segmental and somatic dysfunction of lumbar region: Secondary | ICD-10-CM | POA: Diagnosis not present

## 2022-10-19 DIAGNOSIS — M9905 Segmental and somatic dysfunction of pelvic region: Secondary | ICD-10-CM | POA: Diagnosis not present

## 2022-10-19 DIAGNOSIS — I10 Essential (primary) hypertension: Secondary | ICD-10-CM | POA: Diagnosis not present

## 2022-10-19 DIAGNOSIS — M62830 Muscle spasm of back: Secondary | ICD-10-CM | POA: Diagnosis not present

## 2022-10-19 DIAGNOSIS — N390 Urinary tract infection, site not specified: Secondary | ICD-10-CM | POA: Diagnosis not present

## 2022-11-06 DIAGNOSIS — E1169 Type 2 diabetes mellitus with other specified complication: Secondary | ICD-10-CM | POA: Diagnosis not present

## 2022-11-06 DIAGNOSIS — I10 Essential (primary) hypertension: Secondary | ICD-10-CM | POA: Diagnosis not present

## 2022-11-12 DIAGNOSIS — R011 Cardiac murmur, unspecified: Secondary | ICD-10-CM | POA: Diagnosis not present

## 2022-11-12 DIAGNOSIS — M1990 Unspecified osteoarthritis, unspecified site: Secondary | ICD-10-CM | POA: Diagnosis not present

## 2022-11-12 DIAGNOSIS — E669 Obesity, unspecified: Secondary | ICD-10-CM | POA: Diagnosis not present

## 2022-11-12 DIAGNOSIS — F331 Major depressive disorder, recurrent, moderate: Secondary | ICD-10-CM | POA: Diagnosis not present

## 2022-11-12 DIAGNOSIS — I10 Essential (primary) hypertension: Secondary | ICD-10-CM | POA: Diagnosis not present

## 2022-11-12 DIAGNOSIS — E782 Mixed hyperlipidemia: Secondary | ICD-10-CM | POA: Diagnosis not present

## 2022-11-12 DIAGNOSIS — Z8673 Personal history of transient ischemic attack (TIA), and cerebral infarction without residual deficits: Secondary | ICD-10-CM | POA: Diagnosis not present

## 2022-11-12 DIAGNOSIS — E1169 Type 2 diabetes mellitus with other specified complication: Secondary | ICD-10-CM | POA: Diagnosis not present

## 2022-11-12 DIAGNOSIS — E119 Type 2 diabetes mellitus without complications: Secondary | ICD-10-CM | POA: Diagnosis not present

## 2022-12-10 DIAGNOSIS — U071 COVID-19: Secondary | ICD-10-CM | POA: Diagnosis not present

## 2022-12-10 DIAGNOSIS — R509 Fever, unspecified: Secondary | ICD-10-CM | POA: Diagnosis not present

## 2022-12-10 DIAGNOSIS — R059 Cough, unspecified: Secondary | ICD-10-CM | POA: Diagnosis not present

## 2022-12-24 DIAGNOSIS — M9905 Segmental and somatic dysfunction of pelvic region: Secondary | ICD-10-CM | POA: Diagnosis not present

## 2022-12-24 DIAGNOSIS — M9903 Segmental and somatic dysfunction of lumbar region: Secondary | ICD-10-CM | POA: Diagnosis not present

## 2022-12-24 DIAGNOSIS — M9902 Segmental and somatic dysfunction of thoracic region: Secondary | ICD-10-CM | POA: Diagnosis not present

## 2022-12-24 DIAGNOSIS — M546 Pain in thoracic spine: Secondary | ICD-10-CM | POA: Diagnosis not present

## 2022-12-24 DIAGNOSIS — M62830 Muscle spasm of back: Secondary | ICD-10-CM | POA: Diagnosis not present

## 2022-12-31 DIAGNOSIS — M546 Pain in thoracic spine: Secondary | ICD-10-CM | POA: Diagnosis not present

## 2022-12-31 DIAGNOSIS — M9902 Segmental and somatic dysfunction of thoracic region: Secondary | ICD-10-CM | POA: Diagnosis not present

## 2022-12-31 DIAGNOSIS — M9905 Segmental and somatic dysfunction of pelvic region: Secondary | ICD-10-CM | POA: Diagnosis not present

## 2022-12-31 DIAGNOSIS — M9903 Segmental and somatic dysfunction of lumbar region: Secondary | ICD-10-CM | POA: Diagnosis not present

## 2022-12-31 DIAGNOSIS — M62830 Muscle spasm of back: Secondary | ICD-10-CM | POA: Diagnosis not present

## 2023-01-07 DIAGNOSIS — M9903 Segmental and somatic dysfunction of lumbar region: Secondary | ICD-10-CM | POA: Diagnosis not present

## 2023-01-07 DIAGNOSIS — M62830 Muscle spasm of back: Secondary | ICD-10-CM | POA: Diagnosis not present

## 2023-01-07 DIAGNOSIS — M546 Pain in thoracic spine: Secondary | ICD-10-CM | POA: Diagnosis not present

## 2023-01-07 DIAGNOSIS — M9905 Segmental and somatic dysfunction of pelvic region: Secondary | ICD-10-CM | POA: Diagnosis not present

## 2023-01-07 DIAGNOSIS — M9902 Segmental and somatic dysfunction of thoracic region: Secondary | ICD-10-CM | POA: Diagnosis not present

## 2023-01-14 DIAGNOSIS — M546 Pain in thoracic spine: Secondary | ICD-10-CM | POA: Diagnosis not present

## 2023-01-14 DIAGNOSIS — M62830 Muscle spasm of back: Secondary | ICD-10-CM | POA: Diagnosis not present

## 2023-01-14 DIAGNOSIS — M9902 Segmental and somatic dysfunction of thoracic region: Secondary | ICD-10-CM | POA: Diagnosis not present

## 2023-01-14 DIAGNOSIS — M9905 Segmental and somatic dysfunction of pelvic region: Secondary | ICD-10-CM | POA: Diagnosis not present

## 2023-01-14 DIAGNOSIS — M9903 Segmental and somatic dysfunction of lumbar region: Secondary | ICD-10-CM | POA: Diagnosis not present

## 2023-01-28 DIAGNOSIS — M9902 Segmental and somatic dysfunction of thoracic region: Secondary | ICD-10-CM | POA: Diagnosis not present

## 2023-01-28 DIAGNOSIS — M9905 Segmental and somatic dysfunction of pelvic region: Secondary | ICD-10-CM | POA: Diagnosis not present

## 2023-01-28 DIAGNOSIS — M62830 Muscle spasm of back: Secondary | ICD-10-CM | POA: Diagnosis not present

## 2023-01-28 DIAGNOSIS — M9903 Segmental and somatic dysfunction of lumbar region: Secondary | ICD-10-CM | POA: Diagnosis not present

## 2023-01-28 DIAGNOSIS — M546 Pain in thoracic spine: Secondary | ICD-10-CM | POA: Diagnosis not present

## 2023-03-04 DIAGNOSIS — M546 Pain in thoracic spine: Secondary | ICD-10-CM | POA: Diagnosis not present

## 2023-03-04 DIAGNOSIS — M9902 Segmental and somatic dysfunction of thoracic region: Secondary | ICD-10-CM | POA: Diagnosis not present

## 2023-03-04 DIAGNOSIS — M62830 Muscle spasm of back: Secondary | ICD-10-CM | POA: Diagnosis not present

## 2023-03-04 DIAGNOSIS — M9903 Segmental and somatic dysfunction of lumbar region: Secondary | ICD-10-CM | POA: Diagnosis not present

## 2023-03-04 DIAGNOSIS — M9905 Segmental and somatic dysfunction of pelvic region: Secondary | ICD-10-CM | POA: Diagnosis not present

## 2023-03-08 DIAGNOSIS — M9903 Segmental and somatic dysfunction of lumbar region: Secondary | ICD-10-CM | POA: Diagnosis not present

## 2023-03-08 DIAGNOSIS — M9902 Segmental and somatic dysfunction of thoracic region: Secondary | ICD-10-CM | POA: Diagnosis not present

## 2023-03-08 DIAGNOSIS — M546 Pain in thoracic spine: Secondary | ICD-10-CM | POA: Diagnosis not present

## 2023-03-08 DIAGNOSIS — M9905 Segmental and somatic dysfunction of pelvic region: Secondary | ICD-10-CM | POA: Diagnosis not present

## 2023-03-08 DIAGNOSIS — M62830 Muscle spasm of back: Secondary | ICD-10-CM | POA: Diagnosis not present

## 2023-03-13 DIAGNOSIS — M9902 Segmental and somatic dysfunction of thoracic region: Secondary | ICD-10-CM | POA: Diagnosis not present

## 2023-03-13 DIAGNOSIS — M546 Pain in thoracic spine: Secondary | ICD-10-CM | POA: Diagnosis not present

## 2023-03-13 DIAGNOSIS — M9903 Segmental and somatic dysfunction of lumbar region: Secondary | ICD-10-CM | POA: Diagnosis not present

## 2023-03-13 DIAGNOSIS — M9905 Segmental and somatic dysfunction of pelvic region: Secondary | ICD-10-CM | POA: Diagnosis not present

## 2023-03-13 DIAGNOSIS — M62830 Muscle spasm of back: Secondary | ICD-10-CM | POA: Diagnosis not present

## 2023-03-18 DIAGNOSIS — E119 Type 2 diabetes mellitus without complications: Secondary | ICD-10-CM | POA: Diagnosis not present

## 2023-03-22 DIAGNOSIS — M62830 Muscle spasm of back: Secondary | ICD-10-CM | POA: Diagnosis not present

## 2023-03-22 DIAGNOSIS — M546 Pain in thoracic spine: Secondary | ICD-10-CM | POA: Diagnosis not present

## 2023-03-22 DIAGNOSIS — M9905 Segmental and somatic dysfunction of pelvic region: Secondary | ICD-10-CM | POA: Diagnosis not present

## 2023-03-22 DIAGNOSIS — M9902 Segmental and somatic dysfunction of thoracic region: Secondary | ICD-10-CM | POA: Diagnosis not present

## 2023-03-22 DIAGNOSIS — M9903 Segmental and somatic dysfunction of lumbar region: Secondary | ICD-10-CM | POA: Diagnosis not present

## 2023-04-26 DIAGNOSIS — M9902 Segmental and somatic dysfunction of thoracic region: Secondary | ICD-10-CM | POA: Diagnosis not present

## 2023-04-26 DIAGNOSIS — M62830 Muscle spasm of back: Secondary | ICD-10-CM | POA: Diagnosis not present

## 2023-04-26 DIAGNOSIS — M9903 Segmental and somatic dysfunction of lumbar region: Secondary | ICD-10-CM | POA: Diagnosis not present

## 2023-04-26 DIAGNOSIS — M9905 Segmental and somatic dysfunction of pelvic region: Secondary | ICD-10-CM | POA: Diagnosis not present

## 2023-04-26 DIAGNOSIS — M546 Pain in thoracic spine: Secondary | ICD-10-CM | POA: Diagnosis not present

## 2023-05-23 DIAGNOSIS — M546 Pain in thoracic spine: Secondary | ICD-10-CM | POA: Diagnosis not present

## 2023-05-23 DIAGNOSIS — M9902 Segmental and somatic dysfunction of thoracic region: Secondary | ICD-10-CM | POA: Diagnosis not present

## 2023-05-23 DIAGNOSIS — M9905 Segmental and somatic dysfunction of pelvic region: Secondary | ICD-10-CM | POA: Diagnosis not present

## 2023-05-23 DIAGNOSIS — M9903 Segmental and somatic dysfunction of lumbar region: Secondary | ICD-10-CM | POA: Diagnosis not present

## 2023-05-23 DIAGNOSIS — M62830 Muscle spasm of back: Secondary | ICD-10-CM | POA: Diagnosis not present

## 2023-05-27 DIAGNOSIS — I10 Essential (primary) hypertension: Secondary | ICD-10-CM | POA: Diagnosis not present

## 2023-05-27 DIAGNOSIS — E1169 Type 2 diabetes mellitus with other specified complication: Secondary | ICD-10-CM | POA: Diagnosis not present

## 2023-05-31 DIAGNOSIS — E782 Mixed hyperlipidemia: Secondary | ICD-10-CM | POA: Diagnosis not present

## 2023-05-31 DIAGNOSIS — E1169 Type 2 diabetes mellitus with other specified complication: Secondary | ICD-10-CM | POA: Diagnosis not present

## 2023-05-31 DIAGNOSIS — Z8673 Personal history of transient ischemic attack (TIA), and cerebral infarction without residual deficits: Secondary | ICD-10-CM | POA: Diagnosis not present

## 2023-05-31 DIAGNOSIS — E119 Type 2 diabetes mellitus without complications: Secondary | ICD-10-CM | POA: Diagnosis not present

## 2023-05-31 DIAGNOSIS — I10 Essential (primary) hypertension: Secondary | ICD-10-CM | POA: Diagnosis not present

## 2023-05-31 DIAGNOSIS — D72829 Elevated white blood cell count, unspecified: Secondary | ICD-10-CM | POA: Diagnosis not present

## 2023-05-31 DIAGNOSIS — F331 Major depressive disorder, recurrent, moderate: Secondary | ICD-10-CM | POA: Diagnosis not present

## 2023-05-31 DIAGNOSIS — Z0001 Encounter for general adult medical examination with abnormal findings: Secondary | ICD-10-CM | POA: Diagnosis not present

## 2023-06-17 DIAGNOSIS — M9905 Segmental and somatic dysfunction of pelvic region: Secondary | ICD-10-CM | POA: Diagnosis not present

## 2023-06-17 DIAGNOSIS — M9903 Segmental and somatic dysfunction of lumbar region: Secondary | ICD-10-CM | POA: Diagnosis not present

## 2023-06-17 DIAGNOSIS — M9902 Segmental and somatic dysfunction of thoracic region: Secondary | ICD-10-CM | POA: Diagnosis not present

## 2023-06-17 DIAGNOSIS — M546 Pain in thoracic spine: Secondary | ICD-10-CM | POA: Diagnosis not present

## 2023-06-17 DIAGNOSIS — M62830 Muscle spasm of back: Secondary | ICD-10-CM | POA: Diagnosis not present

## 2023-07-15 DIAGNOSIS — M9905 Segmental and somatic dysfunction of pelvic region: Secondary | ICD-10-CM | POA: Diagnosis not present

## 2023-07-15 DIAGNOSIS — M9902 Segmental and somatic dysfunction of thoracic region: Secondary | ICD-10-CM | POA: Diagnosis not present

## 2023-07-15 DIAGNOSIS — M9903 Segmental and somatic dysfunction of lumbar region: Secondary | ICD-10-CM | POA: Diagnosis not present

## 2023-07-15 DIAGNOSIS — M546 Pain in thoracic spine: Secondary | ICD-10-CM | POA: Diagnosis not present

## 2023-07-15 DIAGNOSIS — M62830 Muscle spasm of back: Secondary | ICD-10-CM | POA: Diagnosis not present

## 2023-07-23 ENCOUNTER — Ambulatory Visit: Attending: Cardiology | Admitting: Cardiology

## 2023-07-23 ENCOUNTER — Encounter: Payer: Self-pay | Admitting: Cardiology

## 2023-07-23 VITALS — BP 128/74 | HR 110 | Ht 62.0 in | Wt 206.2 lb

## 2023-07-23 DIAGNOSIS — E782 Mixed hyperlipidemia: Secondary | ICD-10-CM

## 2023-07-23 DIAGNOSIS — R011 Cardiac murmur, unspecified: Secondary | ICD-10-CM

## 2023-07-23 DIAGNOSIS — I4891 Unspecified atrial fibrillation: Secondary | ICD-10-CM

## 2023-07-23 NOTE — Patient Instructions (Signed)
 Medication Instructions:  Your physician recommends that you continue on your current medications as directed. Please refer to the Current Medication list given to you today.  *If you need a refill on your cardiac medications before your next appointment, please call your pharmacy*  Lab Work: None If you have labs (blood work) drawn today and your tests are completely normal, you will receive your results only by: MyChart Message (if you have MyChart) OR A paper copy in the mail If you have any lab test that is abnormal or we need to change your treatment, we will call you to review the results.  Testing/Procedures: Your physician has requested that you have an echocardiogram. Echocardiography is a painless test that uses sound waves to create images of your heart. It provides your doctor with information about the size and shape of your heart and how well your heart's chambers and valves are working. This procedure takes approximately one hour. There are no restrictions for this procedure. Please do NOT wear cologne, perfume, aftershave, or lotions (deodorant is allowed). Please arrive 15 minutes prior to your appointment time.  Please note: We ask at that you not bring children with you during ultrasound (echo/ vascular) testing. Due to room size and safety concerns, children are not allowed in the ultrasound rooms during exams. Our front office staff cannot provide observation of children in our lobby area while testing is being conducted. An adult accompanying a patient to their appointment will only be allowed in the ultrasound room at the discretion of the ultrasound technician under special circumstances. We apologize for any inconvenience.   Follow-Up: At [facility], you and your health needs are our priority.  As part of our continuing mission to provide you with exceptional heart care, our providers are all part of one team.  This team includes your primary Cardiologist  (physician) and Advanced Practice Providers or APPs (Physician Assistants and Nurse Practitioners) who all work together to provide you with the care you need, when you need it.  Your next appointment:   To Be Determined   Provider:   You may see Armida Lander, MD or one of the following Advanced Practice Providers on your designated Care Team:   Woodfin Hays, PA-C  [place], [place] Theotis Flake, [place]     We recommend signing up for the patient portal called "MyChart".  Sign up information is provided on this After Visit Summary.  MyChart is used to connect with patients for Virtual Visits (Telemedicine).  Patients are able to view lab/test results, encounter notes, upcoming appointments, etc.  Non-urgent messages can be sent to your provider as well.   To learn more about what you can do with MyChart, go to [email or web address].   Other Instructions

## 2023-07-23 NOTE — Progress Notes (Signed)
 Clinical Summary Ms. Zuk is a 73 y.o.female seen today as a new consult, referred by NP Alene Ana for the following medical problems.   1.Heart murmur - 2015 echo: LVEF 50-55%, moderate focal basal septal hypertrophy, grade I dd, mildly sclerotic aortic valve  - reports pcp had mentioned heart murmur about 2 years ago, had been monitored - no SOB/DOE, no LE edema, no chest pains.    2.HLD - labs by pcp  3. CVA - prior history about 10 years ago  4. Afib - after our visit went through some older remote EKGs and found 02/23/18 she did have some episodes of afib - this was an admission with septic shock and bacteremia, SBO/gallstone illeus, eletrolyte abnormalities, GI bleed. From notes just transient afib and resolved during admission.   Past Medical History:  Diagnosis Date   Acid reflux    Arthritis    Cystocele    Hypertension    Kidney stones    Osteopenia 09/2014   T score -2.4 distal third left radius. Remainder of DEXA normal   Stroke [facility])    Uterine prolapse      Allergies  Allergen Reactions   Bee Venom    Claritin [Loratadine] Nausea And Vomiting   Ibuprofen    Sulfonamide Derivatives      Current Outpatient Medications  Medication Sig Dispense Refill   aspirin  EC 325 MG tablet Take 1 tablet (325 mg total) by mouth daily. 30 tablet 0   Calcium  Carbonate-Vitamin D (CALCIUM  + D PO) Take by mouth.     escitalopram (LEXAPRO) 5 MG tablet Take 1 tablet by mouth daily.     esomeprazole (NEXIUM) 20 MG capsule Take 20 mg by mouth daily at 12 noon.     ezetimibe (ZETIA) 10 MG tablet Take 10 mg by mouth daily.     metFORMIN (GLUCOPHAGE) 500 MG tablet Take 500 mg by mouth daily.     Multiple Vitamin (MULTIVITAMIN) tablet Take 1 tablet by mouth daily.     NEXLETOL 180 MG TABS Take 1 tablet by mouth daily.     Omega-3 Fatty Acids (FISH OIL PO) Take by mouth.     OVER THE COUNTER MEDICATION      No current facility-administered medications for this visit.      Past Surgical History:  Procedure Laterality Date   COLON RESECTION N/A 02/24/2018   Procedure: LAPAROSCOPIC ASSISTED/SMALL BOWEL RESECTION convereted to open ;  Surgeon: Dorena Gander, MD;  Location: [facility] OR;  Service: General;  Laterality: N/A;   TONSILLECTOMY       Allergies  Allergen Reactions   Bee Venom    Claritin [Loratadine] Nausea And Vomiting   Ibuprofen    Sulfonamide Derivatives       Family History  Problem Relation Age of Onset   Heart disease Mother    Hypertension Mother    Diabetes Father    Heart disease Father    Hypertension Father    Diabetes Sister    Hypertension Sister    Breast cancer Maternal Aunt        Age 47   Breast cancer Paternal Aunt        Age 75     Social History Ms. Rowzee reports that she has never smoked. She has never used smokeless tobacco. Ms. Eden reports current alcohol  use of about 1.0 standard drink of alcohol  per week.    Physical Examination Today's Vitals   07/23/23 1500  BP: 128/74  Pulse: Aaron Aas)  110  SpO2: 94%  Weight: 206 lb 3.2 oz (93.5 kg)  Height: 5\' 2"  (1.575 m)   Body mass index is 37.71 kg/m.  Gen: resting comfortably, no acute distress HEENT: no scleral icterus, pupils equal round and reactive, no palptable cervical adenopathy,  CV: RRR, 3/6 systolic murmur rusb, no jvd Resp: Clear to auscultation bilaterally GI: abdomen is soft, non-tender, non-distended, normal bowel sounds, no hepatosplenomegaly MSK: extremities are warm, no edema.  Skin: warm, no rash Neuro:  no focal deficits Psych: appropriate affect    Assessment and Plan  1.Heart murmur - noted on exam - 2015 echo with mild aortic sclerosis, she also had some basal septal hypertrophy at that time.  - repeat echo  2. Afib - looks to have been an isolated episode in 2019 in setting of severe systemic illness as listed above - monitor at this time for any signs of recurrence  3. HLD - 05/2023 LDL 72 which is essentially at  goal, continue current meds    F/u pending echo      Laurann Pollock, M.D.

## 2023-08-14 ENCOUNTER — Ambulatory Visit: Admitting: Internal Medicine

## 2023-08-16 DIAGNOSIS — M546 Pain in thoracic spine: Secondary | ICD-10-CM | POA: Diagnosis not present

## 2023-08-16 DIAGNOSIS — M9905 Segmental and somatic dysfunction of pelvic region: Secondary | ICD-10-CM | POA: Diagnosis not present

## 2023-08-16 DIAGNOSIS — M9903 Segmental and somatic dysfunction of lumbar region: Secondary | ICD-10-CM | POA: Diagnosis not present

## 2023-08-16 DIAGNOSIS — M9902 Segmental and somatic dysfunction of thoracic region: Secondary | ICD-10-CM | POA: Diagnosis not present

## 2023-08-16 DIAGNOSIS — M62830 Muscle spasm of back: Secondary | ICD-10-CM | POA: Diagnosis not present

## 2023-09-02 DIAGNOSIS — N958 Other specified menopausal and perimenopausal disorders: Secondary | ICD-10-CM | POA: Diagnosis not present

## 2023-09-02 DIAGNOSIS — M8588 Other specified disorders of bone density and structure, other site: Secondary | ICD-10-CM | POA: Diagnosis not present

## 2023-09-02 DIAGNOSIS — E2839 Other primary ovarian failure: Secondary | ICD-10-CM | POA: Diagnosis not present

## 2023-09-03 ENCOUNTER — Ambulatory Visit (HOSPITAL_COMMUNITY)
Admission: RE | Admit: 2023-09-03 | Discharge: 2023-09-03 | Disposition: A | Discharge status: Home / Self Care | Source: Ambulatory Visit | Attending: Cardiology | Admitting: Cardiology

## 2023-09-03 DIAGNOSIS — R011 Cardiac murmur, unspecified: Secondary | ICD-10-CM | POA: Diagnosis not present

## 2023-09-03 LAB — ECHOCARDIOGRAM COMPLETE
AR max vel: 1.26 cm2
AV Area VTI: 1.39 cm2
AV Area mean vel: 1.34 cm2
AV Mean grad: 10.5 mmHg
AV Peak grad: 19.3 mmHg
Ao pk vel: 2.2 m/s
Area-P 1/2: 2.22 cm2
S' Lateral: 2.8 cm

## 2023-09-03 NOTE — Progress Notes (Signed)
*  PRELIMINARY RESULTS* Echocardiogram 2D Echocardiogram has been performed.  Joanna Good 09/03/2023, 4:05 PM

## 2023-09-09 DIAGNOSIS — M62830 Muscle spasm of back: Secondary | ICD-10-CM | POA: Diagnosis not present

## 2023-09-09 DIAGNOSIS — M9903 Segmental and somatic dysfunction of lumbar region: Secondary | ICD-10-CM | POA: Diagnosis not present

## 2023-09-09 DIAGNOSIS — M9902 Segmental and somatic dysfunction of thoracic region: Secondary | ICD-10-CM | POA: Diagnosis not present

## 2023-09-09 DIAGNOSIS — I10 Essential (primary) hypertension: Secondary | ICD-10-CM | POA: Diagnosis not present

## 2023-09-09 DIAGNOSIS — M546 Pain in thoracic spine: Secondary | ICD-10-CM | POA: Diagnosis not present

## 2023-09-09 DIAGNOSIS — E1169 Type 2 diabetes mellitus with other specified complication: Secondary | ICD-10-CM | POA: Diagnosis not present

## 2023-09-09 DIAGNOSIS — M9905 Segmental and somatic dysfunction of pelvic region: Secondary | ICD-10-CM | POA: Diagnosis not present

## 2023-09-12 DIAGNOSIS — R92343 Mammographic extreme density, bilateral breasts: Secondary | ICD-10-CM | POA: Diagnosis not present

## 2023-09-12 DIAGNOSIS — N6453 Retraction of nipple: Secondary | ICD-10-CM | POA: Diagnosis not present

## 2023-09-13 DIAGNOSIS — E782 Mixed hyperlipidemia: Secondary | ICD-10-CM | POA: Diagnosis not present

## 2023-09-13 DIAGNOSIS — F331 Major depressive disorder, recurrent, moderate: Secondary | ICD-10-CM | POA: Diagnosis not present

## 2023-09-13 DIAGNOSIS — R011 Cardiac murmur, unspecified: Secondary | ICD-10-CM | POA: Diagnosis not present

## 2023-09-13 DIAGNOSIS — I10 Essential (primary) hypertension: Secondary | ICD-10-CM | POA: Diagnosis not present

## 2023-09-13 DIAGNOSIS — E1169 Type 2 diabetes mellitus with other specified complication: Secondary | ICD-10-CM | POA: Diagnosis not present

## 2023-09-13 DIAGNOSIS — M1990 Unspecified osteoarthritis, unspecified site: Secondary | ICD-10-CM | POA: Diagnosis not present

## 2023-09-13 DIAGNOSIS — Z8673 Personal history of transient ischemic attack (TIA), and cerebral infarction without residual deficits: Secondary | ICD-10-CM | POA: Diagnosis not present

## 2023-09-13 DIAGNOSIS — E119 Type 2 diabetes mellitus without complications: Secondary | ICD-10-CM | POA: Diagnosis not present

## 2023-09-24 ENCOUNTER — Ambulatory Visit: Payer: Self-pay | Admitting: Cardiology

## 2023-10-07 DIAGNOSIS — M9905 Segmental and somatic dysfunction of pelvic region: Secondary | ICD-10-CM | POA: Diagnosis not present

## 2023-10-07 DIAGNOSIS — M9902 Segmental and somatic dysfunction of thoracic region: Secondary | ICD-10-CM | POA: Diagnosis not present

## 2023-10-07 DIAGNOSIS — M546 Pain in thoracic spine: Secondary | ICD-10-CM | POA: Diagnosis not present

## 2023-10-07 DIAGNOSIS — M9903 Segmental and somatic dysfunction of lumbar region: Secondary | ICD-10-CM | POA: Diagnosis not present

## 2023-10-07 DIAGNOSIS — M62830 Muscle spasm of back: Secondary | ICD-10-CM | POA: Diagnosis not present

## 2023-10-09 ENCOUNTER — Ambulatory Visit: Admitting: Internal Medicine

## 2023-11-05 DIAGNOSIS — M62830 Muscle spasm of back: Secondary | ICD-10-CM | POA: Diagnosis not present

## 2023-11-05 DIAGNOSIS — M546 Pain in thoracic spine: Secondary | ICD-10-CM | POA: Diagnosis not present

## 2023-11-05 DIAGNOSIS — M9902 Segmental and somatic dysfunction of thoracic region: Secondary | ICD-10-CM | POA: Diagnosis not present

## 2023-11-05 DIAGNOSIS — M9903 Segmental and somatic dysfunction of lumbar region: Secondary | ICD-10-CM | POA: Diagnosis not present

## 2023-11-05 DIAGNOSIS — M9905 Segmental and somatic dysfunction of pelvic region: Secondary | ICD-10-CM | POA: Diagnosis not present

## 2023-12-02 DIAGNOSIS — M546 Pain in thoracic spine: Secondary | ICD-10-CM | POA: Diagnosis not present

## 2023-12-02 DIAGNOSIS — M9903 Segmental and somatic dysfunction of lumbar region: Secondary | ICD-10-CM | POA: Diagnosis not present

## 2023-12-02 DIAGNOSIS — M9902 Segmental and somatic dysfunction of thoracic region: Secondary | ICD-10-CM | POA: Diagnosis not present

## 2023-12-02 DIAGNOSIS — M9905 Segmental and somatic dysfunction of pelvic region: Secondary | ICD-10-CM | POA: Diagnosis not present

## 2023-12-02 DIAGNOSIS — M62830 Muscle spasm of back: Secondary | ICD-10-CM | POA: Diagnosis not present

## 2023-12-19 DIAGNOSIS — E1169 Type 2 diabetes mellitus with other specified complication: Secondary | ICD-10-CM | POA: Diagnosis not present

## 2023-12-19 DIAGNOSIS — I10 Essential (primary) hypertension: Secondary | ICD-10-CM | POA: Diagnosis not present

## 2023-12-25 DIAGNOSIS — D72829 Elevated white blood cell count, unspecified: Secondary | ICD-10-CM | POA: Diagnosis not present

## 2023-12-25 DIAGNOSIS — M1990 Unspecified osteoarthritis, unspecified site: Secondary | ICD-10-CM | POA: Diagnosis not present

## 2023-12-25 DIAGNOSIS — F331 Major depressive disorder, recurrent, moderate: Secondary | ICD-10-CM | POA: Diagnosis not present

## 2023-12-25 DIAGNOSIS — E782 Mixed hyperlipidemia: Secondary | ICD-10-CM | POA: Diagnosis not present

## 2023-12-25 DIAGNOSIS — Z8673 Personal history of transient ischemic attack (TIA), and cerebral infarction without residual deficits: Secondary | ICD-10-CM | POA: Diagnosis not present

## 2023-12-25 DIAGNOSIS — R011 Cardiac murmur, unspecified: Secondary | ICD-10-CM | POA: Diagnosis not present

## 2023-12-25 DIAGNOSIS — I10 Essential (primary) hypertension: Secondary | ICD-10-CM | POA: Diagnosis not present

## 2023-12-25 DIAGNOSIS — E1169 Type 2 diabetes mellitus with other specified complication: Secondary | ICD-10-CM | POA: Diagnosis not present

## 2023-12-30 DIAGNOSIS — M9905 Segmental and somatic dysfunction of pelvic region: Secondary | ICD-10-CM | POA: Diagnosis not present

## 2023-12-30 DIAGNOSIS — M9902 Segmental and somatic dysfunction of thoracic region: Secondary | ICD-10-CM | POA: Diagnosis not present

## 2023-12-30 DIAGNOSIS — M546 Pain in thoracic spine: Secondary | ICD-10-CM | POA: Diagnosis not present

## 2023-12-30 DIAGNOSIS — M9903 Segmental and somatic dysfunction of lumbar region: Secondary | ICD-10-CM | POA: Diagnosis not present

## 2023-12-30 DIAGNOSIS — M62830 Muscle spasm of back: Secondary | ICD-10-CM | POA: Diagnosis not present

## 2024-01-28 DIAGNOSIS — M62830 Muscle spasm of back: Secondary | ICD-10-CM | POA: Diagnosis not present

## 2024-01-28 DIAGNOSIS — M9902 Segmental and somatic dysfunction of thoracic region: Secondary | ICD-10-CM | POA: Diagnosis not present

## 2024-01-28 DIAGNOSIS — M9903 Segmental and somatic dysfunction of lumbar region: Secondary | ICD-10-CM | POA: Diagnosis not present

## 2024-01-28 DIAGNOSIS — M546 Pain in thoracic spine: Secondary | ICD-10-CM | POA: Diagnosis not present

## 2024-01-28 DIAGNOSIS — M9905 Segmental and somatic dysfunction of pelvic region: Secondary | ICD-10-CM | POA: Diagnosis not present
